# Patient Record
Sex: Female | Born: 1975 | Hispanic: Yes | Marital: Married | Smoking: Never smoker
Health system: Southern US, Community
[De-identification: ages and names within clinical notes are randomized; demographics above are authoritative.]

## PROBLEM LIST (undated history)

## (undated) DIAGNOSIS — M419 Scoliosis, unspecified: Secondary | ICD-10-CM

## (undated) DIAGNOSIS — H209 Unspecified iridocyclitis: Secondary | ICD-10-CM

## (undated) DIAGNOSIS — Z8619 Personal history of other infectious and parasitic diseases: Secondary | ICD-10-CM

## (undated) HISTORY — DX: Unspecified iridocyclitis: H20.9

## (undated) HISTORY — DX: Personal history of other infectious and parasitic diseases: Z86.19

## (undated) HISTORY — PX: LAPAROSCOPY: SHX197

---

## 2011-07-31 NOTE — L&D Delivery Note (Signed)
Delivery Note At 2:36 PM a viable and healthy female was delivered via  (Presentation:LOA ).  APGAR:9,9 ; weight pending.   Placenta status:spontaneous and intact with 3 vessel  Cord:  with the following complications: Loose [place] x one reduced.  Cord pH: na  Anesthesia: Epidural  Episiotomy: second degree done for non reassuring FHR Lacerations: none Suture Repair: 2.0 vicryl rapide Est. Blood Loss (mL): 300  Mom to postpartum.  Baby to nursery-stable.  Jazzy Parmer J 02/28/2012, 2:53 PM

## 2011-08-02 LAB — OB RESULTS CONSOLE RPR: RPR: NONREACTIVE

## 2011-08-02 LAB — OB RESULTS CONSOLE ABO/RH: RH Type: POSITIVE

## 2011-08-02 LAB — OB RESULTS CONSOLE GC/CHLAMYDIA: Chlamydia: NEGATIVE

## 2011-08-02 LAB — OB RESULTS CONSOLE HIV ANTIBODY (ROUTINE TESTING): HIV: NONREACTIVE

## 2011-08-02 LAB — OB RESULTS CONSOLE HEPATITIS B SURFACE ANTIGEN: Hepatitis B Surface Ag: NEGATIVE

## 2011-08-02 LAB — OB RESULTS CONSOLE RUBELLA ANTIBODY, IGM: Rubella: IMMUNE

## 2011-08-31 ENCOUNTER — Inpatient Hospital Stay (HOSPITAL_COMMUNITY): Admission: AD | Admit: 2011-08-31 | Payer: Self-pay | Source: Ambulatory Visit | Admitting: Obstetrics and Gynecology

## 2011-12-31 ENCOUNTER — Inpatient Hospital Stay (HOSPITAL_COMMUNITY): Admission: AD | Admit: 2011-12-31 | Payer: Self-pay | Source: Ambulatory Visit | Admitting: Obstetrics and Gynecology

## 2012-02-24 ENCOUNTER — Inpatient Hospital Stay (HOSPITAL_COMMUNITY): Admission: AD | Admit: 2012-02-24 | Payer: Self-pay | Source: Ambulatory Visit | Admitting: Obstetrics and Gynecology

## 2012-02-25 ENCOUNTER — Encounter (HOSPITAL_COMMUNITY): Payer: Self-pay | Admitting: *Deleted

## 2012-02-25 ENCOUNTER — Telehealth (HOSPITAL_COMMUNITY): Payer: Self-pay | Admitting: *Deleted

## 2012-02-25 NOTE — Telephone Encounter (Signed)
Preadmission screen  

## 2012-02-26 ENCOUNTER — Other Ambulatory Visit: Payer: Self-pay | Admitting: Obstetrics and Gynecology

## 2012-02-27 ENCOUNTER — Other Ambulatory Visit: Payer: Self-pay | Admitting: Obstetrics and Gynecology

## 2012-02-27 ENCOUNTER — Inpatient Hospital Stay (HOSPITAL_COMMUNITY)
Admission: RE | Admit: 2012-02-27 | Discharge: 2012-03-01 | DRG: 775 | Disposition: A | Discharge status: Home / Self Care | Payer: 59 | Source: Ambulatory Visit | Attending: Obstetrics and Gynecology | Admitting: Obstetrics and Gynecology

## 2012-02-27 ENCOUNTER — Encounter (HOSPITAL_COMMUNITY): Payer: Self-pay

## 2012-02-27 DIAGNOSIS — Z22330 Carrier of Group B streptococcus: Secondary | ICD-10-CM

## 2012-02-27 DIAGNOSIS — O99892 Other specified diseases and conditions complicating childbirth: Secondary | ICD-10-CM | POA: Diagnosis present

## 2012-02-27 DIAGNOSIS — O480 Post-term pregnancy: Principal | ICD-10-CM | POA: Diagnosis present

## 2012-02-27 HISTORY — DX: Scoliosis, unspecified: M41.9

## 2012-02-27 LAB — CBC
Hemoglobin: 12 g/dL (ref 12.0–15.0)
MCH: 25.5 pg — ABNORMAL LOW (ref 26.0–34.0)
MCHC: 31.3 g/dL (ref 30.0–36.0)
MCV: 81.5 fL (ref 78.0–100.0)
RBC: 4.7 MIL/uL (ref 3.87–5.11)

## 2012-02-27 MED ORDER — ACETAMINOPHEN 325 MG PO TABS: 650.0000 mg | ORAL_TABLET | ORAL | Status: DC | PRN

## 2012-02-27 MED ORDER — IBUPROFEN 600 MG PO TABS: 600.0000 mg | ORAL_TABLET | Freq: Four times a day (QID) | ORAL | Status: DC | PRN

## 2012-02-27 MED ORDER — CEFAZOLIN SODIUM 1-5 GM-% IV SOLN: 1.0000 g | Freq: Three times a day (TID) | INTRAVENOUS | Status: DC

## 2012-02-27 MED ORDER — OXYCODONE-ACETAMINOPHEN 5-325 MG PO TABS: 1.0000 | ORAL_TABLET | ORAL | Status: DC | PRN

## 2012-02-27 MED ORDER — LACTATED RINGERS IV SOLN
500.0000 mL | INTRAVENOUS | Status: DC | PRN
  Administered 2012-02-28: 1000 mL via INTRAVENOUS

## 2012-02-27 MED ORDER — CITRIC ACID-SODIUM CITRATE 334-500 MG/5ML PO SOLN: 30.0000 mL | ORAL | Status: DC | PRN

## 2012-02-27 MED ORDER — CEFAZOLIN SODIUM-DEXTROSE 2-3 GM-% IV SOLR: 2.0000 g | Freq: Once | INTRAVENOUS | Status: DC

## 2012-02-27 MED ORDER — ONDANSETRON HCL 4 MG/2ML IJ SOLN: 4.0000 mg | Freq: Four times a day (QID) | INTRAMUSCULAR | Status: DC | PRN

## 2012-02-27 MED ORDER — LACTATED RINGERS IV SOLN
INTRAVENOUS | Status: DC
  Administered 2012-02-27 – 2012-02-28 (×2): via INTRAVENOUS

## 2012-02-27 MED ORDER — LIDOCAINE HCL (PF) 1 % IJ SOLN: 30.0000 mL | INTRAMUSCULAR | Status: DC | PRN

## 2012-02-27 MED ORDER — CEFAZOLIN SODIUM 1-5 GM-% IV SOLN
1.0000 g | Freq: Three times a day (TID) | INTRAVENOUS | Status: DC
Start: 2012-02-28 — End: 2012-02-28
  Administered 2012-02-28 (×2): 1 g via INTRAVENOUS
  Filled 2012-02-27 (×4): qty 50

## 2012-02-27 MED ORDER — OXYTOCIN 40 UNITS IN LACTATED RINGERS INFUSION - SIMPLE MED
62.5000 mL/h | Freq: Once | INTRAVENOUS | Status: AC
  Administered 2012-02-28: 500 mL/h via INTRAVENOUS

## 2012-02-27 MED ORDER — TERBUTALINE SULFATE 1 MG/ML IJ SOLN: 0.2500 mg | Freq: Once | INTRAMUSCULAR | Status: AC | PRN

## 2012-02-27 MED ORDER — BUTORPHANOL TARTRATE 1 MG/ML IJ SOLN: 1.0000 mg | INTRAMUSCULAR | Status: DC | PRN

## 2012-02-27 MED ORDER — OXYTOCIN 40 UNITS IN LACTATED RINGERS INFUSION - SIMPLE MED
1.0000 m[IU]/min | INTRAVENOUS | Status: DC
  Administered 2012-02-28 (×2): 2 m[IU]/min via INTRAVENOUS
  Filled 2012-02-27: qty 1000

## 2012-02-27 MED ORDER — MISOPROSTOL 25 MCG QUARTER TABLET
25.0000 ug | ORAL_TABLET | ORAL | Status: DC | PRN
  Administered 2012-02-27 – 2012-02-28 (×2): 25 ug via VAGINAL
  Filled 2012-02-27 (×2): qty 0.25

## 2012-02-27 MED ORDER — ZOLPIDEM TARTRATE 5 MG PO TABS: 5.0000 mg | ORAL_TABLET | Freq: Every evening | ORAL | Status: DC | PRN

## 2012-02-27 MED ORDER — FLEET ENEMA 7-19 GM/118ML RE ENEM: 1.0000 | ENEMA | RECTAL | Status: DC | PRN

## 2012-02-27 MED ORDER — OXYTOCIN BOLUS FROM INFUSION
250.0000 mL | Freq: Once | INTRAVENOUS | Status: DC
  Filled 2012-02-27: qty 500

## 2012-02-27 MED ORDER — CEFAZOLIN SODIUM-DEXTROSE 2-3 GM-% IV SOLR
2.0000 g | Freq: Once | INTRAVENOUS | Status: AC
  Administered 2012-02-27: 2 g via INTRAVENOUS
  Filled 2012-02-27: qty 50

## 2012-02-27 NOTE — Progress Notes (Signed)
Charell Faulk is a 36 y.o. G3P0020 at [redacted]w[redacted]d by LMP admitted for induction of labor due to Post dates. Due date 7/25.  Subjective: Comfortable  Objective: BP 114/69  Pulse 84  Resp 18  Ht 5\' 7"  (1.702 m)  Wt 109.77 kg (242 lb)  BMI 37.90 kg/m2  LMP 05/20/2011      FHT:  FHR: 155 bpm, variability: moderate,  accelerations:  Present,  decelerations:  Absent UC:   none SVE:    2-3/80/-1  Labs: No results found for this basename: WBC, HGB, HCT, MCV, PLT    Assessment / Plan: Induction of labor due to postterm,  progressing well on pitocin GBS positive  Labor: Progressing normally Preeclampsia:  na Fetal Wellbeing:  Category I Pain Control:  Labor support without medications I/D:  n/a Anticipated MOD:  NSVD Cytotec ordered  Barrett Holthaus J 02/27/2012, 9:26 PM

## 2012-02-28 ENCOUNTER — Inpatient Hospital Stay (HOSPITAL_COMMUNITY): Payer: 59 | Admitting: Anesthesiology

## 2012-02-28 ENCOUNTER — Encounter (HOSPITAL_COMMUNITY): Payer: Self-pay | Admitting: Anesthesiology

## 2012-02-28 ENCOUNTER — Encounter (HOSPITAL_COMMUNITY): Payer: Self-pay

## 2012-02-28 LAB — ABO/RH: ABO/RH(D): O POS

## 2012-02-28 LAB — TYPE AND SCREEN
ABO/RH(D): O POS
Antibody Screen: NEGATIVE

## 2012-02-28 MED ORDER — EPHEDRINE 5 MG/ML INJ: 10.0000 mg | INTRAVENOUS | Status: DC | PRN

## 2012-02-28 MED ORDER — METHYLERGONOVINE MALEATE 0.2 MG/ML IJ SOLN: 0.2000 mg | INTRAMUSCULAR | Status: DC | PRN

## 2012-02-28 MED ORDER — PREDNISOLONE ACETATE 1 % OP SUSP
1.0000 [drp] | OPHTHALMIC | Status: DC
  Administered 2012-02-28 – 2012-03-01 (×14): 1 [drp] via OPHTHALMIC
  Filled 2012-02-28: qty 1

## 2012-02-28 MED ORDER — PRENATAL MULTIVITAMIN CH
1.0000 | ORAL_TABLET | Freq: Every day | ORAL | Status: DC
  Administered 2012-02-29 – 2012-03-01 (×2): 1 via ORAL
  Filled 2012-02-28 (×2): qty 1

## 2012-02-28 MED ORDER — DIPHENHYDRAMINE HCL 50 MG/ML IJ SOLN: 12.5000 mg | INTRAMUSCULAR | Status: DC | PRN

## 2012-02-28 MED ORDER — ONDANSETRON HCL 4 MG/2ML IJ SOLN: 4.0000 mg | INTRAMUSCULAR | Status: DC | PRN

## 2012-02-28 MED ORDER — SENNOSIDES-DOCUSATE SODIUM 8.6-50 MG PO TABS
2.0000 | ORAL_TABLET | Freq: Every day | ORAL | Status: DC
  Administered 2012-02-28: 2 via ORAL

## 2012-02-28 MED ORDER — BENZOCAINE-MENTHOL 20-0.5 % EX AERO
1.0000 "application " | INHALATION_SPRAY | CUTANEOUS | Status: DC | PRN
  Administered 2012-02-29: 1 via TOPICAL
  Filled 2012-02-28 (×2): qty 56

## 2012-02-28 MED ORDER — DIPHENHYDRAMINE HCL 25 MG PO CAPS: 25.0000 mg | ORAL_CAPSULE | Freq: Four times a day (QID) | ORAL | Status: DC | PRN

## 2012-02-28 MED ORDER — FENTANYL 2.5 MCG/ML BUPIVACAINE 1/10 % EPIDURAL INFUSION (WH - ANES)
14.0000 mL/h | INTRAMUSCULAR | Status: DC
  Administered 2012-02-28: 14 mL/h via EPIDURAL
  Filled 2012-02-28 (×3): qty 60

## 2012-02-28 MED ORDER — IBUPROFEN 600 MG PO TABS
600.0000 mg | ORAL_TABLET | Freq: Four times a day (QID) | ORAL | Status: DC
  Administered 2012-02-28 – 2012-03-01 (×9): 600 mg via ORAL
  Filled 2012-02-28 (×9): qty 1

## 2012-02-28 MED ORDER — METHYLERGONOVINE MALEATE 0.2 MG PO TABS: 0.2000 mg | ORAL_TABLET | ORAL | Status: DC | PRN

## 2012-02-28 MED ORDER — LIDOCAINE HCL (PF) 1 % IJ SOLN
INTRAMUSCULAR | Status: DC | PRN
  Administered 2012-02-28: 5 mL
  Administered 2012-02-28: 8 mL

## 2012-02-28 MED ORDER — PHENYLEPHRINE 40 MCG/ML (10ML) SYRINGE FOR IV PUSH (FOR BLOOD PRESSURE SUPPORT): 80.0000 ug | PREFILLED_SYRINGE | INTRAVENOUS | Status: DC | PRN

## 2012-02-28 MED ORDER — EPHEDRINE 5 MG/ML INJ
10.0000 mg | INTRAVENOUS | Status: DC | PRN
  Filled 2012-02-28: qty 4

## 2012-02-28 MED ORDER — WITCH HAZEL-GLYCERIN EX PADS: 1.0000 "application " | MEDICATED_PAD | CUTANEOUS | Status: DC | PRN

## 2012-02-28 MED ORDER — PHENYLEPHRINE 40 MCG/ML (10ML) SYRINGE FOR IV PUSH (FOR BLOOD PRESSURE SUPPORT)
80.0000 ug | PREFILLED_SYRINGE | INTRAVENOUS | Status: DC | PRN
  Filled 2012-02-28: qty 5

## 2012-02-28 MED ORDER — ZOLPIDEM TARTRATE 5 MG PO TABS: 5.0000 mg | ORAL_TABLET | Freq: Every evening | ORAL | Status: DC | PRN

## 2012-02-28 MED ORDER — SIMETHICONE 80 MG PO CHEW: 80.0000 mg | CHEWABLE_TABLET | ORAL | Status: DC | PRN

## 2012-02-28 MED ORDER — LANOLIN HYDROUS EX OINT: TOPICAL_OINTMENT | CUTANEOUS | Status: DC | PRN

## 2012-02-28 MED ORDER — TETANUS-DIPHTH-ACELL PERTUSSIS 5-2.5-18.5 LF-MCG/0.5 IM SUSP: 0.5000 mL | Freq: Once | INTRAMUSCULAR | Status: DC

## 2012-02-28 MED ORDER — DIBUCAINE 1 % RE OINT: 1.0000 "application " | TOPICAL_OINTMENT | RECTAL | Status: DC | PRN

## 2012-02-28 MED ORDER — FENTANYL 2.5 MCG/ML BUPIVACAINE 1/10 % EPIDURAL INFUSION (WH - ANES)
INTRAMUSCULAR | Status: DC | PRN
  Administered 2012-02-28: 14 mL/h via EPIDURAL

## 2012-02-28 MED ORDER — OXYCODONE-ACETAMINOPHEN 5-325 MG PO TABS
1.0000 | ORAL_TABLET | ORAL | Status: DC | PRN
  Administered 2012-02-29: 1 via ORAL
  Filled 2012-02-28: qty 1

## 2012-02-28 MED ORDER — LACTATED RINGERS IV SOLN: 500.0000 mL | Freq: Once | INTRAVENOUS | Status: DC

## 2012-02-28 MED ORDER — ONDANSETRON HCL 4 MG PO TABS: 4.0000 mg | ORAL_TABLET | ORAL | Status: DC | PRN

## 2012-02-28 NOTE — Anesthesia Procedure Notes (Signed)
Epidural Patient location during procedure: OB Start time: 02/28/2012 9:05 AM End time: 02/28/2012 9:11 AM Reason for block: procedure for pain  Staffing Anesthesiologist: Sandrea Hughs Performed by: anesthesiologist   Preanesthetic Checklist Completed: patient identified, site marked, surgical consent, pre-op evaluation, timeout performed, IV checked, risks and benefits discussed and monitors and equipment checked  Epidural Patient position: sitting Prep: site prepped and draped and DuraPrep Patient monitoring: continuous pulse ox and blood pressure Approach: midline Injection technique: LOR air  Needle:  Needle type: Tuohy  Needle gauge: 17 G Needle length: 9 cm Needle insertion depth: 7 cm Catheter type: closed end flexible Catheter size: 19 Gauge Catheter at skin depth: 12 cm Test dose: negative and Other  Assessment Sensory level: T8 Events: blood not aspirated, injection not painful, no injection resistance, negative IV test and no paresthesia

## 2012-02-28 NOTE — Progress Notes (Signed)
Angela Glenn is a 36 y.o. G3P0020 at [redacted]w[redacted]d by LMP admitted for induction of labor due to Post dates. Due date 7/28.  Subjective: Pushing  Objective: BP 106/20  Pulse 92  Temp 97.8 F (36.6 C) (Oral)  Resp 18  Ht 5\' 7"  (1.702 m)  Wt 109.77 kg (242 lb)  BMI 37.90 kg/m2  SpO2 100%  LMP 05/20/2011      FHT:  FHR: 145 bpm, variability: moderate,  accelerations:  Present,  decelerations:  Present intermittent variable decels, occ late return UC:   regular, every 2-3 minutes SVE:   Dilation: 10 Effacement (%): 80 Station: +1 Exam by:: Dherr rn  Labs: Lab Results  Component Value Date   WBC 13.2* 02/27/2012   HGB 12.0 02/27/2012   HCT 38.3 02/27/2012   MCV 81.5 02/27/2012   PLT 237 02/27/2012    Assessment / Plan: Induction of labor due to postterm,  progressing well on pitocin  Labor: Pushing Preeclampsia:  na Fetal Wellbeing:  Category I and Category II Pain Control:  Epidural I/D:  n/a Anticipated MOD:  NSVD  Angela Glenn J 02/28/2012, 12:55 PM

## 2012-02-28 NOTE — Anesthesia Preprocedure Evaluation (Signed)
Anesthesia Evaluation  Patient identified by MRN, date of birth, ID band Patient awake    Reviewed: Allergy & Precautions, H&P , NPO status , Patient's Chart, lab work & pertinent test results  Airway Mallampati: II TM Distance: >3 FB Neck ROM: full    Dental No notable dental hx.    Pulmonary neg pulmonary ROS,  breath sounds clear to auscultation  Pulmonary exam normal       Cardiovascular negative cardio ROS      Neuro/Psych negative neurological ROS  negative psych ROS   GI/Hepatic negative GI ROS, Neg liver ROS,   Endo/Other  Morbid obesity  Renal/GU negative Renal ROS  negative genitourinary   Musculoskeletal negative musculoskeletal ROS (+)   Abdominal (+) + obese,   Peds negative pediatric ROS (+)  Hematology negative hematology ROS (+)   Anesthesia Other Findings   Reproductive/Obstetrics (+) Pregnancy                           Anesthesia Physical Anesthesia Plan  ASA: III  Anesthesia Plan: Epidural   Post-op Pain Management:    Induction:   Airway Management Planned:   Additional Equipment:   Intra-op Plan:   Post-operative Plan:   Informed Consent: I have reviewed the patients History and Physical, chart, labs and discussed the procedure including the risks, benefits and alternatives for the proposed anesthesia with the patient or authorized representative who has indicated his/her understanding and acceptance.     Plan Discussed with:   Anesthesia Plan Comments:         Anesthesia Quick Evaluation  

## 2012-02-28 NOTE — Progress Notes (Signed)
September Mormile is a 36 y.o. G3P0020 at [redacted]w[redacted]d by LMP admitted for induction of labor due to Post dates. Due date 7/28.  Subjective: Comfortable  Objective: BP 103/60  Pulse 75  Temp 97.8 F (36.6 C) (Oral)  Resp 18  Ht 5\' 7"  (1.702 m)  Wt 109.77 kg (242 lb)  BMI 37.90 kg/m2  LMP 05/20/2011      FHT:  FHR: 155 bpm, variability: moderate,  accelerations:  Present,  decelerations:  Absent UC:   irregular, every 2-5 minutes SVE:   4/80/0 AROM- clear  Labs: Lab Results  Component Value Date   WBC 13.2* 02/27/2012   HGB 12.0 02/27/2012   HCT 38.3 02/27/2012   MCV 81.5 02/27/2012   PLT 237 02/27/2012    Assessment / Plan: Induction of labor due to postterm,  progressing well on pitocin  Labor: Progressing on Pitocin, will continue to increase then AROM Preeclampsia:  na Fetal Wellbeing:  Category I Pain Control:  Labor support without medications I/D:  n/a Anticipated MOD:  NSVD  Sabriel Borromeo J 02/28/2012, 6:58 AM

## 2012-02-28 NOTE — H&P (Signed)
Angela Glenn, Angela Glenn NO.:  [id number]  MEDICAL RECORD NO.:  [id number]  LOCATION:  9173                          FACILITY:  [facility]  PHYSICIAN:  Lenoard Aden, M.D.DATE OF BIRTH:  Aug 26, 1975  DATE OF ADMISSION:  02/27/2012 DATE OF DISCHARGE:                             HISTORY & PHYSICAL   CHIEF COMPLAINT:  Post-date pregnancy.  HISTORY OF PRESENT ILLNESS:  She is a 36 year old white female, G3, P0, at 99 and 4/7th weeks gestation who presents for induction of labor.  ALLERGIES:  To AMOXICILLIN.  MEDICATIONS:  Include prenatal vitamins.  She is a nonsmoker, nondrinker.  She denies domestic or physical violence.  She has a previous history of repetitive pregnancy loss x2.  She has family history of Sjogren syndrome, diabetes, heart disease, hypertension, anemia, osteoarthritis and depression.  She has a personal history of no medical or surgical hospitalizations. She has a personal history of sinusitis, uveitis, and recurrent pregnancy loss.  Prenatal course has been uncomplicated.  PHYSICAL EXAMINATION:  GENERAL:  She is a well-developed, well- nourished, white female, in no acute distress. HEENT:  Normal. NECK:  Supple.  Full range of motion. LUNGS:  Clear. HEART:  Regular rhythm. ABDOMEN:  Soft, gravid, nontender.  Estimated fetal weight 7 pounds. Cervix is 2-3 cm, 80%, vertex, -1, posterior. EXTREMITIES: There are no cords. NEUROLOGIC:  Nonfocal. SKIN:  Intact. PELVIC: NST is reactive.  Cytotec placed.  IMPRESSION: 1. Post-term pregnancy. 2. Group B streptococcus positive. 3. Amoxicillin allergic.  PLAN:  Cytotec now.  Pitocin in the morning.  Epidural as needed.  Start Ancef prophylaxis with labor. Anticipate cautious attempts at vaginal delivery.     Lenoard Aden, M.D.     RJT/MEDQ  D:  02/27/2012  T:  02/28/2012  Job:  [id number]

## 2012-02-28 NOTE — Progress Notes (Signed)
Angela Glenn is a 36 y.o. G3P0020 at [redacted]w[redacted]d by LMP admitted for induction of labor due to Post dates. Due date 7/28.  Subjective: Feels pressure Pushing now   Objective: BP 96/62  Pulse 69  Temp 98.2 F (36.8 C) (Axillary)  Resp 18  Ht 5\' 7"  (1.702 m)  Wt 109.77 kg (242 lb)  BMI 37.90 kg/m2  SpO2 100%  LMP 05/20/2011      FHT:  FHR: 120-130 bpm, variability: moderate,  accelerations:  Present,  decelerations:  Present variables with occc late return noted. UC:   regular, every 2-3 minutes SVE:   Dilation: 10 Effacement (%): 80 Station: +1 Exam by:: Dherr rn  Labs: Lab Results  Component Value Date   WBC 13.2* 02/27/2012   HGB 12.0 02/27/2012   HCT 38.3 02/27/2012   MCV 81.5 02/27/2012   PLT 237 02/27/2012    Assessment / Plan: Induction of labor due to postterm,  progressing well on pitocin  Labor: Progressing normally Preeclampsia:  na Fetal Wellbeing:  Category I and Category II Pain Control:  Epidural I/D:  n/a Anticipated MOD:  NSVD Continue to assess FHR tracing closely.  Cyrah Mclamb J 02/28/2012, 12:01 PM

## 2012-02-29 ENCOUNTER — Encounter (HOSPITAL_COMMUNITY): Payer: Self-pay

## 2012-02-29 LAB — CBC
HCT: 33.8 % — ABNORMAL LOW (ref 36.0–46.0)
Hemoglobin: 10.6 g/dL — ABNORMAL LOW (ref 12.0–15.0)
MCV: 81.6 fL (ref 78.0–100.0)
RBC: 4.14 MIL/uL (ref 3.87–5.11)
WBC: 17.6 10*3/uL — ABNORMAL HIGH (ref 4.0–10.5)

## 2012-02-29 NOTE — Progress Notes (Signed)
Patient ID: Angela Glenn, female   DOB: August 01, 1975, 36 y.o.   MRN: [id number] PPD # 1  Subjective: Pt reports feeling well; some occ cramping/ Pain controlled with ibuprofen Tolerating po/ Voiding without problems/ No n/v Bleeding is light Newborn info:  Information for the patient's newborn:  Glendine, Swetz [[id number]]  female Feeding: breast   Objective:  VS: Blood pressure 118/76, pulse 77, temperature 98.4 F (36.9 C), temperature source Oral, resp. rate 16.    Basename 02/29/12 0525 02/27/12 2100  WBC 17.6* 13.2*  HGB 10.6* 12.0  HCT 33.8* 38.3  PLT 203 237    Blood type: --/--/O POS, O POS (07/31 2100) Rubella: Immune (01/03 0000)    Physical Exam:  General: A & O x 3  alert, cooperative and no distress CV: Regular rate and rhythm Resp: clear Abdomen: soft, nontender, normal bowel sounds Uterine Fundus: firm, below umbilicus, nontender Perineum: not inspected Ext: edema +2 pedal and pretib and Homans sign is negative, no sign of DVT   A/P: PPD # 1/ G3P1021/ S/P:spontaneous vaginal delivery with episiotomy Doing well Continue routine post partum orders Anticipate D/C home in AM    Demetrius Revel, MSN, [facility] 02/29/2012, 9:03 AM

## 2012-02-29 NOTE — Anesthesia Postprocedure Evaluation (Signed)
Anesthesia Post Note  Patient: Angela Glenn  Procedure(s) Performed: * No procedures listed *  Anesthesia type: Epidural  Patient location: Mother/Baby  Post pain: Pain level controlled  Post assessment: Post-op Vital signs reviewed  Last Vitals:  Filed Vitals:   02/29/12 0544  BP: 118/76  Pulse: 77  Temp: 36.9 C  Resp: 16    Post vital signs: Reviewed  Level of consciousness: awake  Complications: No apparent anesthesia complications

## 2012-03-01 MED ORDER — OXYCODONE-ACETAMINOPHEN 5-325 MG PO TABS: 1.0000 | ORAL_TABLET | Freq: Four times a day (QID) | ORAL | Status: AC | PRN

## 2012-03-01 MED ORDER — IBUPROFEN 600 MG PO TABS: 600.0000 mg | ORAL_TABLET | Freq: Four times a day (QID) | ORAL | Status: AC

## 2012-03-01 MED ORDER — DOCUSATE SODIUM 100 MG PO CAPS: 100.0000 mg | ORAL_CAPSULE | Freq: Three times a day (TID) | ORAL | Status: AC | PRN

## 2012-03-01 NOTE — Discharge Summary (Signed)
Obstetric Discharge Summary Reason for Admission: Post dates @ 40w 4d for induction; GBS pos Prenatal Procedures: NST and ultrasound Intrapartum Procedures: spontaneous vaginal delivery Postpartum Procedures: none Complications-Operative and Postpartum: episiotomy Hemoglobin  Date Value Range Status  02/29/2012 10.6* 12.0 - 15.0 g/dL Final     HCT  Date Value Range Status  02/29/2012 33.8* 36.0 - 46.0 % Final    Physical Exam:  General: alert, cooperative and no distress Lochia: appropriate Uterine Fundus: firm Incision: n/a DVT Evaluation: No evidence of DVT seen on physical exam. Negative Homan's sign. +1 - +2 pedal edema  Discharge Diagnoses: Term Pregnancy-delivered  Discharge Information: Date: 03/01/2012 Activity: pelvic rest Diet: routine Medications: PNV, Colace, Iron and Percocet Condition: stable Instructions: refer to practice specific booklet Discharge to: home Follow-up Information    Follow up with Lenoard Aden, MD in 6 weeks.   Contact information:   [address] [place] [address] [phone]          Newborn Data: Live born female on 02/28/12 Birth Weight: 7 lb 7.2 oz (3379 g) APGAR: 9, 9  Home with mother.  Megin Consalvo K 03/01/2012, 9:38 AM

## 2012-03-01 NOTE — Progress Notes (Signed)
Patient ID: Angela Glenn, female   DOB: 08/22/1975, 36 y.o.   MRN: [id number] PPD # 2  Subjective: Pt reports feeling tired, but well and eager for d/c home/ Pain controlled with ibuprofen and percocet Tolerating po/ Voiding without problems/ No n/v Bleeding is light/ Newborn info:  Information for the patient's newborn:  [name], [name] [[id number]]  female Feeding: breast    Objective:  VS: Blood pressure 98/64, pulse 78, temperature 98.3 F (36.8 C), temperature source Oral, resp. rate 18.    Basename 02/29/12 0525 02/27/12 2100  WBC 17.6* 13.2*  HGB 10.6* 12.0  HCT 33.8* 38.3  PLT 203 237    Blood type: --/--/O POS, O POS (07/31 2100) Rubella: Immune (01/03 0000)    Physical Exam:  General: A & O x 3  alert, cooperative and no distress CV: Regular rate and rhythm Resp: clear Abdomen: soft, nontender, normal bowel sounds Uterine Fundus: firm, below umbilicus, nontender Perineum: healing with good reapproximation; mild edema Lochia: minimal Ext: edema +1 to +2 and Homans sign is negative, no sign of DVT   A/P: PPD # 2/ G3P1021/ S/P: SVD w/ episiotomy Doing well and stable for discharge home RX: Ibuprofen 600mg  po Q 6 hrs prn pain #30 Refill x 1 Percocet 5/325 1 to 2 po Q 4 hrs prn pain #15 No refill Colace 100mg  po BID prn #60 Ref x 1 WOB/GYN booklet given Routine pp visit in 6wks   Demetrius Revel, MSN, [facility] 03/01/2012, 9:32 AM

## 2012-03-02 ENCOUNTER — Encounter (HOSPITAL_COMMUNITY)
Admission: RE | Admit: 2012-03-02 | Discharge: 2012-03-02 | Disposition: A | Discharge status: Home / Self Care | Payer: 59 | Source: Ambulatory Visit | Attending: Obstetrics and Gynecology | Admitting: Obstetrics and Gynecology

## 2012-04-02 ENCOUNTER — Encounter (HOSPITAL_COMMUNITY)
Admission: RE | Admit: 2012-04-02 | Discharge: 2012-04-02 | Disposition: A | Discharge status: Home / Self Care | Payer: 59 | Source: Ambulatory Visit | Attending: Obstetrics and Gynecology | Admitting: Obstetrics and Gynecology

## 2012-04-02 DIAGNOSIS — O923 Agalactia: Secondary | ICD-10-CM | POA: Insufficient documentation

## 2014-05-31 ENCOUNTER — Encounter (HOSPITAL_COMMUNITY): Payer: Self-pay

## 2014-11-22 ENCOUNTER — Encounter (HOSPITAL_BASED_OUTPATIENT_CLINIC_OR_DEPARTMENT_OTHER): Payer: Self-pay | Admitting: *Deleted

## 2014-11-22 ENCOUNTER — Emergency Department (HOSPITAL_BASED_OUTPATIENT_CLINIC_OR_DEPARTMENT_OTHER)
Admission: EM | Admit: 2014-11-22 | Discharge: 2014-11-22 | Disposition: A | Discharge status: Home / Self Care | Payer: 59 | Attending: Emergency Medicine | Admitting: Emergency Medicine

## 2014-11-22 DIAGNOSIS — Z7952 Long term (current) use of systemic steroids: Secondary | ICD-10-CM | POA: Diagnosis not present

## 2014-11-22 DIAGNOSIS — Z8619 Personal history of other infectious and parasitic diseases: Secondary | ICD-10-CM | POA: Insufficient documentation

## 2014-11-22 DIAGNOSIS — Z880 Allergy status to penicillin: Secondary | ICD-10-CM | POA: Insufficient documentation

## 2014-11-22 DIAGNOSIS — E876 Hypokalemia: Secondary | ICD-10-CM | POA: Diagnosis not present

## 2014-11-22 DIAGNOSIS — Z8739 Personal history of other diseases of the musculoskeletal system and connective tissue: Secondary | ICD-10-CM | POA: Insufficient documentation

## 2014-11-22 DIAGNOSIS — R630 Anorexia: Secondary | ICD-10-CM | POA: Insufficient documentation

## 2014-11-22 DIAGNOSIS — Z3202 Encounter for pregnancy test, result negative: Secondary | ICD-10-CM | POA: Insufficient documentation

## 2014-11-22 DIAGNOSIS — R112 Nausea with vomiting, unspecified: Secondary | ICD-10-CM | POA: Insufficient documentation

## 2014-11-22 DIAGNOSIS — R5383 Other fatigue: Secondary | ICD-10-CM | POA: Insufficient documentation

## 2014-11-22 DIAGNOSIS — Z8669 Personal history of other diseases of the nervous system and sense organs: Secondary | ICD-10-CM | POA: Diagnosis not present

## 2014-11-22 DIAGNOSIS — R197 Diarrhea, unspecified: Secondary | ICD-10-CM | POA: Diagnosis not present

## 2014-11-22 DIAGNOSIS — E860 Dehydration: Secondary | ICD-10-CM | POA: Diagnosis not present

## 2014-11-22 DIAGNOSIS — Z79899 Other long term (current) drug therapy: Secondary | ICD-10-CM | POA: Diagnosis not present

## 2014-11-22 DIAGNOSIS — R1084 Generalized abdominal pain: Secondary | ICD-10-CM | POA: Insufficient documentation

## 2014-11-22 LAB — URINE MICROSCOPIC-ADD ON

## 2014-11-22 LAB — COMPREHENSIVE METABOLIC PANEL
ALT: 25 U/L (ref 0–35)
AST: 22 U/L (ref 0–37)
Albumin: 4 g/dL (ref 3.5–5.2)
Alkaline Phosphatase: 98 U/L (ref 39–117)
Anion gap: 9 (ref 5–15)
BUN: 16 mg/dL (ref 6–23)
CO2: 24 mmol/L (ref 19–32)
Calcium: 8.6 mg/dL (ref 8.4–10.5)
Chloride: 104 mmol/L (ref 96–112)
Creatinine, Ser: 0.72 mg/dL (ref 0.50–1.10)
GFR calc Af Amer: >90 mL/min (ref >90)
GFR calc non Af Amer: >90 mL/min (ref >90)
Glucose, Bld: 124 mg/dL — ABNORMAL HIGH (ref 70–99)
Potassium: 3.2 mmol/L — ABNORMAL LOW (ref 3.5–5.1)
Sodium: 137 mmol/L (ref 135–145)
Total Bilirubin: 1.3 mg/dL — ABNORMAL HIGH (ref 0.3–1.2)
Total Protein: 7.5 g/dL (ref 6.0–8.3)

## 2014-11-22 LAB — CBC WITH DIFFERENTIAL/PLATELET
Basophils Absolute: 0 10*3/uL (ref 0.0–0.1)
Basophils Relative: 0 % (ref 0–1)
Eosinophils Absolute: 0 10*3/uL (ref 0.0–0.7)
Eosinophils Relative: 0 % (ref 0–5)
HCT: 49.5 % — ABNORMAL HIGH (ref 36.0–46.0)
Hemoglobin: 16.1 g/dL — ABNORMAL HIGH (ref 12.0–15.0)
Lymphocytes Relative: 4 % — ABNORMAL LOW (ref 12–46)
Lymphs Abs: 0.5 10*3/uL — ABNORMAL LOW (ref 0.7–4.0)
MCH: 25.8 pg — ABNORMAL LOW (ref 26.0–34.0)
MCHC: 32.5 g/dL (ref 30.0–36.0)
MCV: 79.5 fL (ref 78.0–100.0)
Monocytes Absolute: 0.7 10*3/uL (ref 0.1–1.0)
Monocytes Relative: 6 % (ref 3–12)
Neutro Abs: 11.1 10*3/uL — ABNORMAL HIGH (ref 1.7–7.7)
Neutrophils Relative %: 90 % — ABNORMAL HIGH (ref 43–77)
Platelets: 270 10*3/uL (ref 150–400)
RBC: 6.23 MIL/uL — ABNORMAL HIGH (ref 3.87–5.11)
RDW: 16.6 % — ABNORMAL HIGH (ref 11.5–15.5)
WBC: 12.4 10*3/uL — ABNORMAL HIGH (ref 4.0–10.5)

## 2014-11-22 LAB — URINALYSIS, ROUTINE W REFLEX MICROSCOPIC
Glucose, UA: NEGATIVE mg/dL
Ketones, ur: >80 mg/dL — AB
Nitrite: NEGATIVE
PH: 5.5 (ref 5.0–8.0)
PROTEIN: 30 mg/dL — AB
Specific Gravity, Urine: 1.036 — ABNORMAL HIGH (ref 1.005–1.030)
UROBILINOGEN UA: 0.2 mg/dL (ref 0.0–1.0)

## 2014-11-22 LAB — LIPASE, BLOOD: Lipase: 22 U/L (ref 11–59)

## 2014-11-22 LAB — PREGNANCY, URINE: PREG TEST UR: NEGATIVE

## 2014-11-22 MED ORDER — PROMETHAZINE HCL 25 MG PO TABS
25.0000 mg | ORAL_TABLET | Freq: Four times a day (QID) | ORAL | Status: DC | PRN
Start: 2014-11-22 — End: 2019-06-30

## 2014-11-22 MED ORDER — POTASSIUM CHLORIDE CRYS ER 20 MEQ PO TBCR
40.0000 meq | EXTENDED_RELEASE_TABLET | Freq: Once | ORAL | Status: AC
  Administered 2014-11-22: 40 meq via ORAL
  Filled 2014-11-22: qty 2

## 2014-11-22 MED ORDER — ONDANSETRON HCL 4 MG/2ML IJ SOLN
4.0000 mg | Freq: Once | INTRAMUSCULAR | Status: AC
  Administered 2014-11-22: 4 mg via INTRAVENOUS
  Filled 2014-11-22: qty 2

## 2014-11-22 MED ORDER — SODIUM CHLORIDE 0.9 % IV BOLUS (SEPSIS)
1000.0000 mL | Freq: Once | INTRAVENOUS | Status: AC
  Administered 2014-11-22: 1000 mL via INTRAVENOUS

## 2014-11-22 NOTE — ED Notes (Signed)
Pt given fluids and crackers for po challenge.

## 2014-11-22 NOTE — ED Notes (Signed)
diarrhea and vomiting since last night.

## 2014-11-22 NOTE — ED Provider Notes (Signed)
CSN: [id number]     Arrival date & time 11/22/14  1832 History   First MD Initiated Contact with Patient 11/22/14 1907     Chief Complaint  Patient presents with  . Diarrhea  . Emesis     (Consider location/radiation/quality/duration/timing/severity/associated sxs/prior Treatment) HPI Pt is a 39yo female presenting to ED with reports of over 10 episodes of vomiting and diarrhea as well as 10 episodes of watery diarrhea w/o blood or mucous, symptoms started last night after pt ate "authentic [place] food"  Last episode of emesis was "projectile" around 3PM and she had 1 loose stool just PTA.  Pt reports generalized fatigue. Believes it is food poisoning or a virus but states no sick contacts.  States everyone else who ate with her is not sick but does state her meal had different meat than her husband's meal.  No recent travel. Reports hx of laparoscopic surgery for an abdominal cyst "many years ago" no other abdominal surgeries. Denies urinary or vaginal symptoms.   Past Medical History  Diagnosis Date  . H/O varicella   . Uveitis     uses steroid eye drops  . Scoliosis   . 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 Laparoscopy     Family History  Problem Relation Age of Onset  . Sjogren's syndrome Mother   . Hypertension Mother   . Arthritis Mother   . Anemia Mother   . Diabetes Father   . Heart attack Maternal Grandfather   . Hypertension Maternal Grandfather    History  Substance Use Topics  . Smoking status: Never Smoker   . Smokeless tobacco: Never Used  . Alcohol Use: No   OB History    Gravida Para Term Preterm AB TAB SAB Ectopic Multiple Living   3 1 1  2  2   1      Review of Systems  Constitutional: Positive for appetite change and fatigue. Negative for fever and chills.  Respiratory: Negative for cough and shortness of breath.   Cardiovascular: Negative for chest pain and palpitations.  Gastrointestinal: Positive for  nausea, vomiting, abdominal pain ( diffuse) and diarrhea. Negative for constipation.  Genitourinary: Negative for dysuria, urgency, frequency, hematuria, flank pain, decreased urine volume, vaginal bleeding, vaginal discharge, vaginal pain and menstrual problem.  Musculoskeletal: Negative for myalgias and back pain.  All other systems reviewed and are negative.     Allergies  Amoxicillin  Home Medications   Prior to Admission medications   Medication Sig Start Date End Date Taking? Authorizing Provider  prednisoLONE acetate (PRED FORTE) 1 % ophthalmic suspension 1 drop 4 (four) times daily.   Yes Historical Provider, MD  calcium acetate (PHOSLO) 667 MG capsule Take 667 mg by mouth 3 (three) times daily with meals.    Historical Provider, MD  ferrous sulfate 325 (65 FE) MG tablet Take 325 mg by mouth daily with breakfast.    Historical Provider, MD  folic acid (FOLVITE) 1 MG tablet Take 1 mg by mouth daily.    Historical Provider, MD  Prenatal Vit-Fe Fumarate-FA (PRENATAL MULTIVITAMIN) TABS Take 1 tablet by mouth daily.    Historical Provider, MD  promethazine (PHENERGAN) 25 MG tablet Take 1 tablet (25 mg total) by mouth every 6 (six) hours as needed for nausea or vomiting. 11/22/14   Junius FinnerErin O'Malley, PA-C   BP 108/67 mmHg  Pulse 84  Temp(Src) 98.5 F (36.9 C) (Oral)  Resp 20  Ht 5\' 7"  (1.702  m)  Wt 220 lb (99.791 kg)  BMI 34.45 kg/m2  SpO2 98%  LMP 11/22/2014 Physical Exam  Constitutional: She appears well-developed and well-nourished. No distress.  HENT:  Head: Normocephalic and atraumatic.  Mouth/Throat: Oropharynx is clear and moist.  Eyes: Conjunctivae are normal. No scleral icterus.  Neck: Normal range of motion. Neck supple.  Cardiovascular: Normal rate, regular rhythm and normal heart sounds.   Pulmonary/Chest: Effort normal and breath sounds normal. No respiratory distress. She has no wheezes. She has no rales. She exhibits no tenderness.  Abdominal: Soft. Bowel sounds  are normal. She exhibits no distension and no mass. There is tenderness. There is no rebound and no guarding.  Soft, non-distended, mild diffuse tenderness. No CVAT  Musculoskeletal: Normal range of motion.  Neurological: She is alert.  Skin: Skin is warm and dry. She is not diaphoretic.  Nursing note and vitals reviewed.   ED Course  Procedures (including critical care time) Labs Review Labs Reviewed  URINALYSIS, ROUTINE W REFLEX MICROSCOPIC - Abnormal; Notable for the following:    Color, Urine AMBER (*)    APPearance TURBID (*)    Specific Gravity, Urine 1.036 (*)    Hgb urine dipstick LARGE (*)    Bilirubin Urine SMALL (*)    Ketones, ur >80 (*)    Protein, ur 30 (*)    Leukocytes, UA SMALL (*)    All other components within normal limits  URINE MICROSCOPIC-ADD ON - Abnormal; Notable for the following:    Squamous Epithelial / LPF MANY (*)    Bacteria, UA FEW (*)    All other components within normal limits  COMPREHENSIVE METABOLIC PANEL - Abnormal; Notable for the following:    Potassium 3.2 (*)    Glucose, Bld 124 (*)    Total Bilirubin 1.3 (*)    All other components within normal limits  CBC WITH DIFFERENTIAL/PLATELET - Abnormal; Notable for the following:    WBC 12.4 (*)    RBC 6.23 (*)    Hemoglobin 16.1 (*)    HCT 49.5 (*)    MCH 25.8 (*)    RDW 16.6 (*)    Neutrophils Relative % 90 (*)    Neutro Abs 11.1 (*)    Lymphocytes Relative 4 (*)    Lymphs Abs 0.5 (*)    All other components within normal limits  PREGNANCY, URINE  LIPASE, BLOOD    Imaging Review No results found.   EKG Interpretation None      MDM   Final diagnoses:  Nausea vomiting and diarrhea  Hypokalemia  Mild dehydration    Pt is a 39yo female presenting to ED with reports of n/v/d after eating [place] food last night. No sick contacts or recent travel. Pt has mild diffuse tenderness in abdomen, low suspicion for acute abdomen, including but not limited to SBO, cholecystitis,  appendicitis, TOA, ectopic pregnancy.   Labs: c/w dehydration. Pt given 2L IV fluids and zofran in ED.  Pt able to keep down PO fluids as well as crackers. Pt states she feels much improved and comfortable being discharged home. No further workup indicated at this time. Home care instructions provided. Rx: phenergan. Return precautions provided. Pt verbalized understanding and agreement with tx plan.   Junius Finner, PA-C 11/22/14 2150  Geoffery Lyons, MD 11/22/14 [phone]

## 2015-06-28 ENCOUNTER — Other Ambulatory Visit: Payer: Self-pay | Admitting: Obstetrics and Gynecology

## 2015-06-28 DIAGNOSIS — R928 Other abnormal and inconclusive findings on diagnostic imaging of breast: Secondary | ICD-10-CM

## 2015-07-04 ENCOUNTER — Other Ambulatory Visit: Payer: 59

## 2015-07-07 ENCOUNTER — Ambulatory Visit
Admission: RE | Admit: 2015-07-07 | Discharge: 2015-07-07 | Disposition: A | Discharge status: Home / Self Care | Payer: 59 | Source: Ambulatory Visit | Attending: Obstetrics and Gynecology | Admitting: Obstetrics and Gynecology

## 2015-07-07 DIAGNOSIS — R928 Other abnormal and inconclusive findings on diagnostic imaging of breast: Secondary | ICD-10-CM

## 2017-01-01 ENCOUNTER — Encounter (HOSPITAL_BASED_OUTPATIENT_CLINIC_OR_DEPARTMENT_OTHER): Payer: Self-pay | Admitting: *Deleted

## 2017-01-01 ENCOUNTER — Emergency Department (HOSPITAL_BASED_OUTPATIENT_CLINIC_OR_DEPARTMENT_OTHER)
Admission: EM | Admit: 2017-01-01 | Discharge: 2017-01-01 | Disposition: A | Discharge status: Home / Self Care | Payer: 59 | Attending: Physician Assistant | Admitting: Physician Assistant

## 2017-01-01 ENCOUNTER — Emergency Department (HOSPITAL_BASED_OUTPATIENT_CLINIC_OR_DEPARTMENT_OTHER): Payer: 59

## 2017-01-01 DIAGNOSIS — M545 Low back pain: Secondary | ICD-10-CM | POA: Insufficient documentation

## 2017-01-01 DIAGNOSIS — R51 Headache: Secondary | ICD-10-CM | POA: Insufficient documentation

## 2017-01-01 DIAGNOSIS — Y999 Unspecified external cause status: Secondary | ICD-10-CM | POA: Insufficient documentation

## 2017-01-01 DIAGNOSIS — Y92410 Unspecified street and highway as the place of occurrence of the external cause: Secondary | ICD-10-CM | POA: Insufficient documentation

## 2017-01-01 DIAGNOSIS — Y9389 Activity, other specified: Secondary | ICD-10-CM | POA: Diagnosis not present

## 2017-01-01 DIAGNOSIS — S0990XA Unspecified injury of head, initial encounter: Secondary | ICD-10-CM | POA: Diagnosis present

## 2017-01-01 DIAGNOSIS — M542 Cervicalgia: Secondary | ICD-10-CM | POA: Diagnosis not present

## 2017-01-01 LAB — PREGNANCY, URINE: Preg Test, Ur: NEGATIVE

## 2017-01-01 MED ORDER — NAPROXEN 375 MG PO TABS: 375.0000 mg | ORAL_TABLET | Freq: Two times a day (BID) | ORAL | 0 refills | Status: DC

## 2017-01-01 MED ORDER — HYDROCODONE-ACETAMINOPHEN 5-325 MG PO TABS
1.0000 | ORAL_TABLET | Freq: Once | ORAL | Status: AC
  Administered 2017-01-01: 1 via ORAL
  Filled 2017-01-01: qty 1

## 2017-01-01 MED ORDER — CYCLOBENZAPRINE HCL 10 MG PO TABS: 10.0000 mg | ORAL_TABLET | Freq: Two times a day (BID) | ORAL | 0 refills | Status: DC | PRN

## 2017-01-01 MED ORDER — KETOROLAC TROMETHAMINE 30 MG/ML IJ SOLN
15.0000 mg | Freq: Once | INTRAMUSCULAR | Status: AC
  Administered 2017-01-01: 15 mg via INTRAMUSCULAR
  Filled 2017-01-01: qty 1

## 2017-01-01 MED ORDER — CYCLOBENZAPRINE HCL 5 MG PO TABS
ORAL_TABLET | ORAL | Status: AC
  Filled 2017-01-01: qty 2

## 2017-01-01 MED ORDER — CYCLOBENZAPRINE HCL 5 MG PO TABS
7.5000 mg | ORAL_TABLET | Freq: Once | ORAL | Status: AC
  Administered 2017-01-01: 7.5 mg via ORAL

## 2017-01-01 NOTE — ED Notes (Signed)
Pt verbalizes understanding of d/c instructions and denies any further needs at this time. 
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
## 2017-01-01 NOTE — Discharge Instructions (Signed)
Imaging was normal. This is likely a musculoskeletal injury. Take the naproxen as needed for pain. Avoid any extra ibuprofen, Aleve, Advil, Motrin. Take the Flexeril for muscle relaxation. This medication makes her drowsy. She may also take Tylenol. Heating to the affected area. Warm soaks in Epsom salt. Make sure he follow up with a primary care doctor or return to the ED if symptoms are not improving.

## 2017-01-01 NOTE — ED Notes (Signed)
Pt returned from xray
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
## 2017-01-01 NOTE — ED Notes (Signed)
Patient transported to X-ray 

## 2019-06-24 ENCOUNTER — Emergency Department (HOSPITAL_COMMUNITY): Payer: 59

## 2019-06-24 ENCOUNTER — Emergency Department (HOSPITAL_COMMUNITY)
Admission: EM | Admit: 2019-06-24 | Discharge: 2019-06-24 | Disposition: A | Discharge status: Home / Self Care | Payer: 59 | Attending: Emergency Medicine | Admitting: Emergency Medicine

## 2019-06-24 ENCOUNTER — Telehealth: Payer: Self-pay

## 2019-06-24 ENCOUNTER — Other Ambulatory Visit: Payer: Self-pay

## 2019-06-24 DIAGNOSIS — Z79899 Other long term (current) drug therapy: Secondary | ICD-10-CM | POA: Insufficient documentation

## 2019-06-24 DIAGNOSIS — Y998 Other external cause status: Secondary | ICD-10-CM | POA: Diagnosis not present

## 2019-06-24 DIAGNOSIS — Y9289 Other specified places as the place of occurrence of the external cause: Secondary | ICD-10-CM | POA: Diagnosis not present

## 2019-06-24 DIAGNOSIS — W108XXA Fall (on) (from) other stairs and steps, initial encounter: Secondary | ICD-10-CM | POA: Insufficient documentation

## 2019-06-24 DIAGNOSIS — Y9301 Activity, walking, marching and hiking: Secondary | ICD-10-CM | POA: Diagnosis not present

## 2019-06-24 DIAGNOSIS — W19XXXA Unspecified fall, initial encounter: Secondary | ICD-10-CM

## 2019-06-24 DIAGNOSIS — S8991XA Unspecified injury of right lower leg, initial encounter: Secondary | ICD-10-CM | POA: Diagnosis present

## 2019-06-24 DIAGNOSIS — S82851A Displaced trimalleolar fracture of right lower leg, initial encounter for closed fracture: Secondary | ICD-10-CM | POA: Diagnosis not present

## 2019-06-24 MED ORDER — HYDROMORPHONE HCL 1 MG/ML IJ SOLN
0.5000 mg | Freq: Once | INTRAMUSCULAR | Status: AC
  Administered 2019-06-24: 0.5 mg via INTRAVENOUS
  Filled 2019-06-24: qty 1

## 2019-06-24 MED ORDER — PROPOFOL 10 MG/ML IV BOLUS
0.5000 mg/kg | Freq: Once | INTRAVENOUS | Status: DC
  Filled 2019-06-24: qty 20

## 2019-06-24 MED ORDER — HYDROMORPHONE HCL 1 MG/ML IJ SOLN
1.0000 mg | Freq: Once | INTRAMUSCULAR | Status: AC
  Administered 2019-06-24: 1 mg via INTRAVENOUS
  Filled 2019-06-24: qty 1

## 2019-06-24 MED ORDER — ONDANSETRON HCL 4 MG/2ML IJ SOLN
4.0000 mg | Freq: Once | INTRAMUSCULAR | Status: AC
  Administered 2019-06-24: 4 mg via INTRAVENOUS
  Filled 2019-06-24: qty 2

## 2019-06-24 MED ORDER — SODIUM CHLORIDE 0.9 % IV BOLUS
1000.0000 mL | Freq: Once | INTRAVENOUS | Status: AC
  Administered 2019-06-24: 13:00:00 1000 mL via INTRAVENOUS

## 2019-06-24 MED ORDER — OXYCODONE-ACETAMINOPHEN 5-325 MG PO TABS: 1.0000 | ORAL_TABLET | ORAL | 0 refills | Status: AC | PRN

## 2019-06-24 MED ORDER — PROPOFOL 10 MG/ML IV BOLUS
INTRAVENOUS | Status: AC | PRN
Start: 2019-06-24 — End: 2019-06-24
  Administered 2019-06-24 (×2): 50 mg via INTRAVENOUS
  Administered 2019-06-24 (×2): 20 mg via INTRAVENOUS
  Administered 2019-06-24: 30 mg via INTRAVENOUS

## 2019-06-24 NOTE — ED Triage Notes (Signed)
Per EMS: Patient fell down the stairs and her ankle twisted. Patient has PMS. Patient was given 50 mcgs of Fentanyl and patient did not tolerate well and her pressure decreased.  146/76 Last BP HR 80 RR 16 100% on RA 300 NS 20 g in right hand

## 2019-06-24 NOTE — Discharge Instructions (Signed)
Recommend taking Tylenol, Motrin for pain control.  For breakthrough pain, please take prescribed Percocet.  Please note this medicine can make you drowsy and should not be taken while driving or operating heavy machinery.  Please keep the splint clean and dry.  If you develop severe pain, skin discoloration, new ankle deformity, please return to ER or talk to your orthopedic doctor for quick reevaluation.  Please go to the appointment next week with your orthopedic doctor.  You will need surgery for your ankle.

## 2019-06-24 NOTE — ED Notes (Signed)
EDP at bedside  

## 2019-06-24 NOTE — ED Provider Notes (Signed)
.  Sedation  Date/Time: 06/24/2019 12:38 PM Performed by: Lennice Sites, DO Authorized by: Lennice Sites, DO   Consent:    Consent obtained:  Verbal   Consent given by:  Patient   Risks discussed:  Allergic reaction, dysrhythmia, inadequate sedation, nausea, prolonged hypoxia resulting in organ damage, prolonged sedation necessitating reversal, respiratory compromise necessitating ventilatory assistance and intubation and vomiting   Alternatives discussed:  Analgesia without sedation, anxiolysis and regional anesthesia Universal protocol:    Procedure explained and questions answered to patient or proxy's satisfaction: yes     Relevant documents present and verified: yes     Test results available and properly labeled: yes     Imaging studies available: yes     Required blood products, implants, devices, and special equipment available: yes     Site/side marked: yes     Immediately prior to procedure a time out was called: yes     Patient identity confirmation method:  Verbally with patient Indications:    Procedure necessitating sedation performed by:  Different physician Pre-sedation assessment:    Time since last food or drink:  12 hours   ASA classification: class 2 - patient with mild systemic disease     Neck mobility: normal     Mouth opening:  3 or more finger widths   Thyromental distance:  4 finger widths   Mallampati score:  I - soft palate, uvula, fauces, pillars visible   Pre-sedation assessments completed and reviewed: airway patency, cardiovascular function, hydration status, mental status, nausea/vomiting, pain level, respiratory function and temperature     Pre-sedation assessment completed:  06/24/2019 12:38 PM Immediate pre-procedure details:    Reassessment: Patient reassessed immediately prior to procedure     Reviewed: vital signs, relevant labs/tests and NPO status     Verified: bag valve mask available, emergency equipment available, intubation equipment  available, IV patency confirmed, oxygen available and suction available   Procedure details (see MAR for exact dosages):    Preoxygenation:  Nasal cannula   Sedation:  Propofol   Intended level of sedation: deep   Intra-procedure monitoring:  Blood pressure monitoring, cardiac monitor, continuous pulse oximetry, frequent LOC assessments, frequent vital sign checks and continuous capnometry   Intra-procedure events: none     Total Provider sedation time (minutes):  20 Post-procedure details:    Post-sedation assessment completed:  06/24/2019 1:25 PM   Attendance: Constant attendance by certified staff until patient recovered     Recovery: Patient returned to pre-procedure baseline     Post-sedation assessments completed and reviewed: airway patency, cardiovascular function, hydration status, mental status, nausea/vomiting, pain level, respiratory function and temperature     Patient is stable for discharge or admission: yes     Patient tolerance:  Tolerated well, no immediate complications  Sedation done for right ankle fracture.  Tolerated sedation well.    Lennice Sites, DO 06/24/19 1325

## 2019-06-24 NOTE — ED Notes (Signed)
D/c delayed by pain medication administration and attempting to get patient cleaned and in the car.

## 2019-06-24 NOTE — Telephone Encounter (Signed)
Patient needs appt with Dr Erlinda Hong. Called patient, husband answered and states he will ask wife to call us back.    * Needs appt with Dr Erlinda Hong for Tuesday.

## 2019-06-24 NOTE — ED Provider Notes (Signed)
[facility]-EMERGENCY DEPT Provider Note   CSN: [id number] Arrival date & time: 06/24/19  1829     History   Chief Complaint Chief Complaint  Patient presents with  . Fall  . Ankle Pain    HPI Angela Glenn is a 43 y.o. female.  Presents to ER with right ankle pain.  Patient states fell down stairs, twisting her right ankle, noted deformity.  Severe 10 out of 10 pain sharp, stabbing, worse with movement.  Denies associated injuries, denies hitting her head.  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      HPI  Past Medical History:  Diagnosis Date  . H/O varicella   . Scoliosis   . 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 Uveitis    uses steroid eye drops    Patient Active Problem List   Diagnosis Date Noted  . 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 LAPAROSCOPY       OB History    Gravida  3   Para  1   Term  1   Preterm      AB  2   Living  1     SAB  2   TAB      Ectopic      Multiple      Live Births  1            Home Medications    Prior to Admission medications   Medication Sig Start Date End Date Taking? Authorizing Provider  calcium acetate (PHOSLO) 667 MG capsule Take 667 mg by mouth 3 (three) times daily with meals.    [provider]  cyclobenzaprine (FLEXERIL) 10 MG tablet Take 1 tablet (10 mg total) by mouth 2 (two) times daily as needed for muscle spasms. 01/01/17   Rise Mu, PA-C  ferrous sulfate 325 (65 FE) MG tablet Take 325 mg by mouth daily with breakfast.    [provider]  folic acid (FOLVITE) 1 MG tablet Take 1 mg by mouth daily.    [provider]  naproxen (NAPROSYN) 375 MG tablet Take 1 tablet (375 mg total) by mouth 2 (two) times daily. 01/01/17   Rise Mu, PA-C  prednisoLONE acetate (PRED FORTE) 1 % ophthalmic suspension 1 drop 4 (four) times daily.     [provider]  Prenatal Vit-Fe Fumarate-FA (PRENATAL MULTIVITAMIN) TABS Take 1 tablet by mouth daily.    [provider]  promethazine (PHENERGAN) 25 MG tablet Take 1 tablet (25 mg total) by mouth every 6 (six) hours as needed for nausea or vomiting. 11/22/14   Lurene Shadow, PA-C    Family History Family History  Problem Relation Age of Onset  . Sjogren's syndrome Mother   . Hypertension Mother   . Arthritis Mother   . Anemia Mother   . Diabetes Father   . Heart attack Maternal Grandfather   . Hypertension Maternal Grandfather     Social History Social History   Tobacco Use  . Smoking status: Never Smoker  . Smokeless tobacco: Never Used  Substance Use Topics  . Alcohol use: No  . Drug use: No     Allergies   Shellfish allergy and Amoxicillin   Review of Systems Review of Systems  Constitutional: Negative for chills and fever.  HENT: Negative for ear pain and sore throat.   Eyes: Negative  for pain and visual disturbance.  Respiratory: Negative for cough and shortness of breath.   Cardiovascular: Negative for chest pain and palpitations.  Gastrointestinal: Negative for abdominal pain and vomiting.  Genitourinary: Negative for dysuria and hematuria.  Musculoskeletal: Positive for arthralgias. Negative for back pain.  Skin: Negative for color change and rash.  Neurological: Negative for seizures and syncope.  All other systems reviewed and are negative.    Physical Exam Updated Vital Signs BP (!) 136/94 (BP Location: Right Arm)   Pulse 72   Temp 98 F (36.7 C)   Resp 16   LMP 05/30/2019 (Approximate)   SpO2 100%   Physical Exam Vitals signs and nursing note reviewed.  Constitutional:      General: She is not in acute distress.    Appearance: She is well-developed.  HENT:     Head: Normocephalic and atraumatic.  Eyes:     Conjunctiva/sclera: Conjunctivae normal.  Neck:     Musculoskeletal: Neck supple.  Cardiovascular:      Rate and Rhythm: Normal rate and regular rhythm.     Heart sounds: No murmur.  Pulmonary:     Effort: Pulmonary effort is normal. No respiratory distress.     Breath sounds: Normal breath sounds.  Abdominal:     Palpations: Abdomen is soft.     Tenderness: There is no abdominal tenderness.  Musculoskeletal:     Comments: RLE: Obvious deformity to right ankle, exquisite tenderness over ankle, range of motion limited due to pain, DP pulse intact, distal sensation, cap refill intact, no tenderness palpation over knee, thigh, hip.  Skin:    General: Skin is warm and dry.  Neurological:     General: No focal deficit present.     Mental Status: She is alert and oriented to person, place, and time.      ED Treatments / Results  Labs (all labs ordered are listed, but only abnormal results are displayed) Labs Reviewed - No data to display  EKG None  Radiology No results found.  Procedures Reduction of fracture  Date/Time: 06/24/2019 5:31 PM Performed by: Milagros Lollykstra, Chana Lindstrom S, MD Authorized by: Milagros Lollykstra, Levar Fayson S, MD  Consent: Verbal consent obtained. Written consent obtained. Risks and benefits: risks, benefits and alternatives were discussed Consent given by: patient and spouse Patient understanding: patient states understanding of the procedure being performed Patient consent: the patient's understanding of the procedure matches consent given Procedure consent: procedure consent matches procedure scheduled Relevant documents: relevant documents present and verified Test results: test results available and properly labeled Site marked: the operative site was marked Imaging studies: imaging studies available Patient identity confirmed: verbally with patient, provided demographic data and arm band Time out: Immediately prior to procedure a "time out" was called to verify the correct patient, procedure, equipment, support staff and site/side marked as required. Local anesthesia  used: no  Anesthesia: Local anesthesia used: no  Sedation: Patient sedated: yes Sedation type: moderate (conscious) sedation Sedatives: see MAR for details and propofol  Comments: After conscious sedation initiated, patient adequately sedated, knee was partially flexed, applied distal traction to foot as well as slight dorsiflexion and foot inversion, achieved adequate reduction of fracture, anatomic appearance, post reduction DP pulses intact, sensation intact, motor intact, cap refill intact    (including critical care time)  Medications Ordered in ED Medications  HYDROmorphone (DILAUDID) injection 0.5 mg (has no administration in time range)     Initial Impression / Assessment and Plan / ED Course  I have reviewed  the triage vital signs and the nursing notes.  Pertinent labs & imaging results that were available during my care of the patient were reviewed by me and considered in my medical decision making (see chart for details).  Clinical Course as of Jun 23 1728  Wed Jun 24, 2019  1140 Paged ortho   [RD]  1200 Rechecked, updated on results and plan for ortho consultation   [RD]  [place] [facility]   [RD]  5397 Discussed with Erlinda Hong, recommends we attempt closed reduction in, requests contact after post reduction films   [RD]  1325 Completed closed reduction, reviewed post films, will notify Ortho   [RD]  1340 Discussed with Xu, reduction looks good per his review, requests CT for operative planning, here someone from his office will call patient to schedule surgery as out patient   [RD]    Clinical Course User Index [RD] Lucrezia Starch, MD       43 year old presented to ER after mechanical fall, ankle deformity.  Work-up concerning for displaced trimalleolar fracture.  Reviewed with on-call Ortho, Dr. Erlinda Hong.  Recommends reduction in ER.  Successful reduction.  Near anatomic alignment.  Post reduction neurovascularly intact.  CT ordered for operative planning.  Given Rx  for pain medicine, strict return precautions.  Ortho office coordinating likely surgery early next week.  After the discussed management above, the patient was determined to be safe for discharge.  The patient was in agreement with this plan and all questions regarding their care were answered.  ED return precautions were discussed and the patient will return to the ED with any significant worsening of condition.   Final Clinical Impressions(s) / ED Diagnoses   Final diagnoses:  Closed trimalleolar fracture of right ankle, initial encounter  Fall, initial encounter    ED Discharge Orders    None       Lucrezia Starch, MD 06/24/19 1733

## 2019-06-29 ENCOUNTER — Telehealth: Payer: Self-pay

## 2019-06-29 NOTE — Telephone Encounter (Signed)
I advised patient message below. She insists she would like a Callback from Dr Erlinda Hong. Please call patient ASAP. Thanks. Having a lot of pain also.

## 2019-06-29 NOTE — Telephone Encounter (Signed)
Patient called stating that she wasn't sure if she was suppose to see Dr. Erlinda Hong on Tuesday, 06/30/2019 or if she was suppose to have surgery.  I advised her that she has an appointment on 06/30/2019 with Dr. Erlinda Hong and that he is in clinic all day on Tuesday.   Patient stated that her right heel is hurting on and off since Thursday and she is feeling numbness in her right calf that started last night.  Cb# is [phone].  Please advise.  Thank you.

## 2019-06-29 NOTE — Telephone Encounter (Signed)
Yes she should come in tomorrow at her scheduled appt

## 2019-06-29 NOTE — Telephone Encounter (Signed)
  She should come in for OV 06/30/2019 correct.

## 2019-06-29 NOTE — Telephone Encounter (Signed)
Called and answered her questions.   

## 2019-06-30 ENCOUNTER — Other Ambulatory Visit: Payer: Self-pay

## 2019-06-30 ENCOUNTER — Encounter: Payer: Self-pay | Admitting: Orthopaedic Surgery

## 2019-06-30 ENCOUNTER — Other Ambulatory Visit (HOSPITAL_COMMUNITY)
Admission: RE | Admit: 2019-06-30 | Discharge: 2019-06-30 | Disposition: A | Discharge status: Home / Self Care | Payer: 59 | Source: Ambulatory Visit | Attending: Orthopaedic Surgery | Admitting: Orthopaedic Surgery

## 2019-06-30 ENCOUNTER — Ambulatory Visit (INDEPENDENT_AMBULATORY_CARE_PROVIDER_SITE_OTHER): Payer: 59 | Admitting: Orthopaedic Surgery

## 2019-06-30 ENCOUNTER — Encounter (HOSPITAL_BASED_OUTPATIENT_CLINIC_OR_DEPARTMENT_OTHER): Payer: Self-pay | Admitting: *Deleted

## 2019-06-30 DIAGNOSIS — Z01812 Encounter for preprocedural laboratory examination: Secondary | ICD-10-CM | POA: Insufficient documentation

## 2019-06-30 DIAGNOSIS — Z20828 Contact with and (suspected) exposure to other viral communicable diseases: Secondary | ICD-10-CM | POA: Insufficient documentation

## 2019-06-30 DIAGNOSIS — S82851A Displaced trimalleolar fracture of right lower leg, initial encounter for closed fracture: Secondary | ICD-10-CM | POA: Insufficient documentation

## 2019-06-30 LAB — SARS CORONAVIRUS 2 (TAT 6-24 HRS): SARS Coronavirus 2: NEGATIVE

## 2019-06-30 NOTE — Progress Notes (Signed)
Office Visit Note   Patient: Angela Glenn           Date of Birth: 1976-07-22           MRN: [id number] Visit Date: 06/30/2019              Requested by: No referring provider defined for this encounter. PCP: Patient, No Pcp Per   Assessment & Plan: Visit Diagnoses:  1. Closed trimalleolar fracture of ankle, right, initial encounter     Plan: Impression is displaced right trimalleolar ankle fracture dislocation.  X-rays and CT scan reviewed with the patient in detail and based on discussion patient agrees to proceed with surgical repair.  Risk benefits alternatives and rehab and recovery were all reviewed with patient in detail today.  Questions answered to her satisfaction.  The splint was reapplied today.  She will continue with elevation.  We will plan for surgery tomorrow. Total face to face encounter time was greater than 45 minutes and over half of this time was spent in counseling and/or coordination of care.  Follow-Up Instructions: Return in about 2 weeks (around 07/14/2019).   Orders:  No orders of the defined types were placed in this encounter.  No orders of the defined types were placed in this encounter.     Procedures: No procedures performed   Clinical Data: No additional findings.   Subjective: Chief Complaint  Patient presents with  . Right Ankle - Pain    [name] is a 43 year old female who is here for ER follow-up for a displaced right trimalleolar ankle fracture dislocation that she suffered last week.  This was successfully reduced in the ER under propofol sedation.  She is taking oxycodone aspirin.  She has been elevating.  She feels some throbbing burning and numbness and tingling in the calf and thigh.   Review of Systems  Constitutional: Negative.   HENT: Negative.   Eyes: Negative.   Respiratory: Negative.   Cardiovascular: Negative.   Endocrine: Negative.   Musculoskeletal: Negative.   Neurological: Negative.   Hematological:  Negative.   Psychiatric/Behavioral: Negative.   All other systems reviewed and are negative.    Objective: Vital Signs: LMP 06/14/2019   Physical Exam Vitals signs and nursing note reviewed.  Constitutional:      Appearance: She is well-developed.  HENT:     Head: Normocephalic and atraumatic.  Neck:     Musculoskeletal: Neck supple.  Pulmonary:     Effort: Pulmonary effort is normal.  Abdominal:     Palpations: Abdomen is soft.  Skin:    General: Skin is warm.     Capillary Refill: Capillary refill takes less than 2 seconds.  Neurological:     Mental Status: She is alert and oriented to person, place, and time.  Psychiatric:        Behavior: Behavior normal.        Thought Content: Thought content normal.        Judgment: Judgment normal.     Ortho Exam right lower extremity exam shows a fracture blister on the anterior medial aspect of the tibia.   Her skin does wrinkle with pinched.  Neurovascular intact.  She has moderate swelling.  Good distal pulses.  Specialty Comments:  No specialty comments available.  Imaging: No results found.   PMFS History: Patient Active Problem List   Diagnosis Date Noted  . Closed trimalleolar fracture of ankle, right, initial encounter 06/30/2019  . 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 H/O varicella   . Scoliosis   . 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 Uveitis    uses steroid eye drops    Family History  Problem Relation Age of Onset  . Sjogren's syndrome Mother   . Hypertension Mother   . Arthritis Mother   . Anemia Mother   . Diabetes Father   . Heart attack Maternal Grandfather   . Hypertension Maternal Grandfather     Past Surgical History:  Procedure Laterality Date  . LAPAROSCOPY     Social History   Occupational History  . Not on file  Tobacco Use  . Smoking status: Never Smoker  . Smokeless tobacco: Never Used  Substance and Sexual Activity  . Alcohol use: No  . Drug use: No   . Sexual activity: Yes

## 2019-07-01 ENCOUNTER — Encounter (HOSPITAL_BASED_OUTPATIENT_CLINIC_OR_DEPARTMENT_OTHER)
Admission: RE | Disposition: A | Discharge status: Home / Self Care | Payer: Self-pay | Source: Home / Self Care | Attending: Orthopaedic Surgery

## 2019-07-01 ENCOUNTER — Encounter (HOSPITAL_BASED_OUTPATIENT_CLINIC_OR_DEPARTMENT_OTHER): Payer: Self-pay | Admitting: Anesthesiology

## 2019-07-01 ENCOUNTER — Ambulatory Visit (HOSPITAL_BASED_OUTPATIENT_CLINIC_OR_DEPARTMENT_OTHER): Payer: 59 | Admitting: Anesthesiology

## 2019-07-01 ENCOUNTER — Ambulatory Visit (HOSPITAL_BASED_OUTPATIENT_CLINIC_OR_DEPARTMENT_OTHER)
Admission: RE | Admit: 2019-07-01 | Discharge: 2019-07-01 | Disposition: A | Discharge status: Home / Self Care | Payer: 59 | Attending: Orthopaedic Surgery | Admitting: Orthopaedic Surgery

## 2019-07-01 ENCOUNTER — Ambulatory Visit (HOSPITAL_COMMUNITY): Payer: 59

## 2019-07-01 ENCOUNTER — Other Ambulatory Visit: Payer: Self-pay

## 2019-07-01 DIAGNOSIS — Z880 Allergy status to penicillin: Secondary | ICD-10-CM | POA: Diagnosis not present

## 2019-07-01 DIAGNOSIS — H209 Unspecified iridocyclitis: Secondary | ICD-10-CM | POA: Insufficient documentation

## 2019-07-01 DIAGNOSIS — X58XXXA Exposure to other specified factors, initial encounter: Secondary | ICD-10-CM | POA: Insufficient documentation

## 2019-07-01 DIAGNOSIS — M419 Scoliosis, unspecified: Secondary | ICD-10-CM | POA: Insufficient documentation

## 2019-07-01 DIAGNOSIS — Z7982 Long term (current) use of aspirin: Secondary | ICD-10-CM | POA: Insufficient documentation

## 2019-07-01 DIAGNOSIS — S82851A Displaced trimalleolar fracture of right lower leg, initial encounter for closed fracture: Secondary | ICD-10-CM | POA: Diagnosis present

## 2019-07-01 DIAGNOSIS — Z419 Encounter for procedure for purposes other than remedying health state, unspecified: Secondary | ICD-10-CM

## 2019-07-01 HISTORY — PX: ORIF ANKLE FRACTURE: SHX5408

## 2019-07-01 LAB — POCT PREGNANCY, URINE: Preg Test, Ur: NEGATIVE

## 2019-07-01 SURGERY — OPEN REDUCTION INTERNAL FIXATION (ORIF) ANKLE FRACTURE
Anesthesia: General | Site: Ankle | Laterality: Right

## 2019-07-01 MED ORDER — PROMETHAZINE HCL 25 MG PO TABS: 25.0000 mg | ORAL_TABLET | Freq: Four times a day (QID) | ORAL | 1 refills | Status: AC | PRN

## 2019-07-01 MED ORDER — HYDROMORPHONE HCL 1 MG/ML IJ SOLN
INTRAMUSCULAR | Status: AC
  Filled 2019-07-01: qty 0.5

## 2019-07-01 MED ORDER — LIDOCAINE 2% (20 MG/ML) 5 ML SYRINGE
INTRAMUSCULAR | Status: AC
  Filled 2019-07-01: qty 5

## 2019-07-01 MED ORDER — FENTANYL CITRATE (PF) 100 MCG/2ML IJ SOLN
50.0000 ug | INTRAMUSCULAR | Status: DC | PRN
  Administered 2019-07-01: 100 ug via INTRAVENOUS

## 2019-07-01 MED ORDER — CALCIUM CARBONATE-VITAMIN D 500-200 MG-UNIT PO TABS: 1.0000 | ORAL_TABLET | Freq: Three times a day (TID) | ORAL | 6 refills | Status: AC

## 2019-07-01 MED ORDER — LACTATED RINGERS IV SOLN: INTRAVENOUS | Status: DC

## 2019-07-01 MED ORDER — CHLORHEXIDINE GLUCONATE 4 % EX LIQD: 60.0000 mL | Freq: Once | CUTANEOUS | Status: DC

## 2019-07-01 MED ORDER — EPHEDRINE SULFATE 50 MG/ML IJ SOLN
INTRAMUSCULAR | Status: DC | PRN
  Administered 2019-07-01: 10 mg via INTRAVENOUS

## 2019-07-01 MED ORDER — ZINC SULFATE 220 (50 ZN) MG PO CAPS: 220.0000 mg | ORAL_CAPSULE | Freq: Every day | ORAL | 0 refills | Status: AC

## 2019-07-01 MED ORDER — FENTANYL CITRATE (PF) 100 MCG/2ML IJ SOLN
INTRAMUSCULAR | Status: DC | PRN
  Administered 2019-07-01 (×2): 25 ug via INTRAVENOUS
  Administered 2019-07-01: 50 ug via INTRAVENOUS

## 2019-07-01 MED ORDER — LIDOCAINE-EPINEPHRINE (PF) 1.5 %-1:200000 IJ SOLN
INTRAMUSCULAR | Status: DC | PRN
  Administered 2019-07-01: 30 mL via PERINEURAL

## 2019-07-01 MED ORDER — DEXAMETHASONE SODIUM PHOSPHATE 10 MG/ML IJ SOLN
INTRAMUSCULAR | Status: AC
  Filled 2019-07-01: qty 1

## 2019-07-01 MED ORDER — SODIUM CHLORIDE (PF) 0.9 % IJ SOLN
INTRAMUSCULAR | Status: AC
  Filled 2019-07-01: qty 10

## 2019-07-01 MED ORDER — BUPIVACAINE-EPINEPHRINE (PF) 0.5% -1:200000 IJ SOLN
INTRAMUSCULAR | Status: DC | PRN
  Administered 2019-07-01: 30 mL via PERINEURAL

## 2019-07-01 MED ORDER — CEFAZOLIN SODIUM-DEXTROSE 2-4 GM/100ML-% IV SOLN
INTRAVENOUS | Status: AC
  Filled 2019-07-01: qty 100

## 2019-07-01 MED ORDER — MIDAZOLAM HCL 2 MG/2ML IJ SOLN
1.0000 mg | INTRAMUSCULAR | Status: DC | PRN
  Administered 2019-07-01: 2 mg via INTRAVENOUS

## 2019-07-01 MED ORDER — KETOROLAC TROMETHAMINE 10 MG PO TABS: 10.0000 mg | ORAL_TABLET | Freq: Two times a day (BID) | ORAL | 0 refills | Status: AC | PRN

## 2019-07-01 MED ORDER — LACTATED RINGERS IV SOLN
INTRAVENOUS | Status: DC
  Administered 2019-07-01 (×2): via INTRAVENOUS

## 2019-07-01 MED ORDER — EPHEDRINE 5 MG/ML INJ
INTRAVENOUS | Status: AC
  Filled 2019-07-01: qty 10

## 2019-07-01 MED ORDER — CEFAZOLIN SODIUM-DEXTROSE 2-4 GM/100ML-% IV SOLN
2.0000 g | INTRAVENOUS | Status: AC
  Administered 2019-07-01: 3 g via INTRAVENOUS

## 2019-07-01 MED ORDER — DEXAMETHASONE SODIUM PHOSPHATE 4 MG/ML IJ SOLN
INTRAMUSCULAR | Status: DC | PRN
  Administered 2019-07-01: 10 mg via INTRAVENOUS

## 2019-07-01 MED ORDER — OXYCODONE HCL 5 MG PO TABS: 5.0000 mg | ORAL_TABLET | Freq: Three times a day (TID) | ORAL | 0 refills | Status: AC | PRN

## 2019-07-01 MED ORDER — MIDAZOLAM HCL 2 MG/2ML IJ SOLN
INTRAMUSCULAR | Status: AC
  Filled 2019-07-01: qty 2

## 2019-07-01 MED ORDER — SENNOSIDES-DOCUSATE SODIUM 8.6-50 MG PO TABS: 1.0000 | ORAL_TABLET | Freq: Every evening | ORAL | 1 refills | Status: AC | PRN

## 2019-07-01 MED ORDER — METHOCARBAMOL 500 MG PO TABS: 500.0000 mg | ORAL_TABLET | Freq: Four times a day (QID) | ORAL | 2 refills | Status: DC | PRN

## 2019-07-01 MED ORDER — ONDANSETRON HCL 4 MG/2ML IJ SOLN: 4.0000 mg | Freq: Once | INTRAMUSCULAR | Status: DC | PRN

## 2019-07-01 MED ORDER — HYDROMORPHONE HCL 1 MG/ML IJ SOLN
0.2500 mg | INTRAMUSCULAR | Status: DC | PRN
  Administered 2019-07-01 (×2): 0.5 mg via INTRAVENOUS

## 2019-07-01 MED ORDER — FENTANYL CITRATE (PF) 100 MCG/2ML IJ SOLN
INTRAMUSCULAR | Status: AC
  Filled 2019-07-01: qty 2

## 2019-07-01 MED ORDER — PROPOFOL 10 MG/ML IV BOLUS
INTRAVENOUS | Status: DC | PRN
  Administered 2019-07-01: 100 mg via INTRAVENOUS

## 2019-07-01 MED ORDER — PROPOFOL 10 MG/ML IV BOLUS
INTRAVENOUS | Status: AC
  Filled 2019-07-01: qty 20

## 2019-07-01 MED ORDER — LIDOCAINE 2% (20 MG/ML) 5 ML SYRINGE
INTRAMUSCULAR | Status: DC | PRN
  Administered 2019-07-01: 100 mg via INTRAVENOUS

## 2019-07-01 MED ORDER — MEPERIDINE HCL 25 MG/ML IJ SOLN: 6.2500 mg | INTRAMUSCULAR | Status: DC | PRN

## 2019-07-01 MED ORDER — CEFAZOLIN SODIUM 1 G IJ SOLR
INTRAMUSCULAR | Status: AC
  Filled 2019-07-01: qty 10

## 2019-07-01 MED ORDER — ONDANSETRON HCL 4 MG/2ML IJ SOLN
INTRAMUSCULAR | Status: AC
  Filled 2019-07-01: qty 2

## 2019-07-01 MED ORDER — ONDANSETRON HCL 4 MG/2ML IJ SOLN
INTRAMUSCULAR | Status: DC | PRN
  Administered 2019-07-01: 4 mg via INTRAVENOUS

## 2019-07-01 SURGICAL SUPPLY — 105 items
BANDAGE ESMARK 6X9 LF (GAUZE/BANDAGES/DRESSINGS) ×1 IMPLANT
BIT DRILL 1.8 (BIT) ×1
BIT DRILL 125X2.7XAO QCK (BIT) IMPLANT
BIT DRILL 137X1.8XAO QCK (BIT) IMPLANT
BIT DRILL 2.7 (BIT) ×1
BIT DRILL CANN QC 2.85X190 (BIT) ×1 IMPLANT
BIT DRILL QC ANT 3.5X110 (BIT) ×1 IMPLANT
BIT DRL 125X2.7XAO QCK (BIT) ×1
BIT DRL 137X1.8XAO QCK (BIT) ×1
BLADE HEX COATED 2.75 (ELECTRODE) ×2 IMPLANT
BLADE SURG 15 STRL LF DISP TIS (BLADE) ×2 IMPLANT
BLADE SURG 15 STRL SS (BLADE) ×4
BNDG COHESIVE 6X5 TAN STRL LF (GAUZE/BANDAGES/DRESSINGS) ×2 IMPLANT
BNDG ELASTIC 4X5.8 VLCR STR LF (GAUZE/BANDAGES/DRESSINGS) ×1 IMPLANT
BNDG ELASTIC 6X5.8 VLCR STR LF (GAUZE/BANDAGES/DRESSINGS) ×2 IMPLANT
BNDG ESMARK 6X9 LF (GAUZE/BANDAGES/DRESSINGS) ×2
BRUSH SCRUB EZ PLAIN DRY (MISCELLANEOUS) ×2 IMPLANT
CANISTER SUCT 1200ML W/VALVE (MISCELLANEOUS) ×2 IMPLANT
COVER BACK TABLE REUSABLE LG (DRAPES) ×2 IMPLANT
COVER MAYO STAND REUSABLE (DRAPES) IMPLANT
COVER WAND RF STERILE (DRAPES) IMPLANT
CUFF TOURN SGL QUICK 34 (TOURNIQUET CUFF)
CUFF TOURN SGL QUICK 42 (TOURNIQUET CUFF) ×1 IMPLANT
CUFF TRNQT CYL 34X4.125X (TOURNIQUET CUFF) IMPLANT
DECANTER SPIKE VIAL GLASS SM (MISCELLANEOUS) IMPLANT
DRAPE C-ARM 42X72 X-RAY (DRAPES) ×2 IMPLANT
DRAPE C-ARMOR (DRAPES) ×2 IMPLANT
DRAPE EXTREMITY T 121X128X90 (DISPOSABLE) ×2 IMPLANT
DRAPE HALF SHEET 70X43 (DRAPES) ×2 IMPLANT
DRAPE IMP U-DRAPE 54X76 (DRAPES) ×2 IMPLANT
DRAPE INCISE IOBAN 66X45 STRL (DRAPES) IMPLANT
DRAPE OEC MINIVIEW 54X84 (DRAPES) IMPLANT
DRAPE SURG 17X23 STRL (DRAPES) ×2 IMPLANT
DRAPE U-SHAPE 47X51 STRL (DRAPES) ×1 IMPLANT
DRSG PAD ABDOMINAL 8X10 ST (GAUZE/BANDAGES/DRESSINGS) ×7 IMPLANT
DURAPREP 26ML APPLICATOR (WOUND CARE) ×5 IMPLANT
ELECT REM PT RETURN 9FT ADLT (ELECTROSURGICAL) ×2
ELECTRODE REM PT RTRN 9FT ADLT (ELECTROSURGICAL) ×1 IMPLANT
GAUZE SPONGE 4X4 12PLY STRL (GAUZE/BANDAGES/DRESSINGS) ×3 IMPLANT
GAUZE XEROFORM 1X8 LF (GAUZE/BANDAGES/DRESSINGS) ×2 IMPLANT
GAUZE XEROFORM 5X9 LF (GAUZE/BANDAGES/DRESSINGS) ×1 IMPLANT
GLOVE BIOGEL PI IND STRL 7.0 (GLOVE) ×1 IMPLANT
GLOVE BIOGEL PI INDICATOR 7.0 (GLOVE) ×1
GLOVE ECLIPSE 7.0 STRL STRAW (GLOVE) ×2 IMPLANT
GLOVE SKINSENSE NS SZ7.5 (GLOVE) ×1
GLOVE SKINSENSE STRL SZ7.5 (GLOVE) ×1 IMPLANT
GLOVE SURG SYN 7.5  E (GLOVE) ×1
GLOVE SURG SYN 7.5 E (GLOVE) ×1 IMPLANT
GLOVE SURG SYN 7.5 PF PI (GLOVE) ×1 IMPLANT
GOWN STRL REIN XL XLG (GOWN DISPOSABLE) ×2 IMPLANT
GOWN STRL REUS W/ TWL LRG LVL3 (GOWN DISPOSABLE) ×1 IMPLANT
GOWN STRL REUS W/ TWL XL LVL3 (GOWN DISPOSABLE) ×1 IMPLANT
GOWN STRL REUS W/TWL LRG LVL3 (GOWN DISPOSABLE) ×1
GOWN STRL REUS W/TWL XL LVL3 (GOWN DISPOSABLE) ×1
K-WIRE 1.6 (WIRE) ×2
K-WIRE FX150X1.6XTROC TIP (WIRE) ×2
K-WIRE TROCAR TIP 1.4X150 (WIRE) ×4
KWIRE FX 150X1.6 TROC TIP (WIRE) IMPLANT
KWIRE TROCAR TIP 1.4X150 (WIRE) IMPLANT
MANIFOLD NEPTUNE II (INSTRUMENTS) ×2 IMPLANT
NEEDLE HYPO 22GX1.5 SAFETY (NEEDLE) IMPLANT
NS IRRIG 1000ML POUR BTL (IV SOLUTION) ×2 IMPLANT
PACK BASIN DAY SURGERY FS (CUSTOM PROCEDURE TRAY) ×2 IMPLANT
PAD CAST 3X4 CTTN HI CHSV (CAST SUPPLIES) IMPLANT
PAD CAST 4YDX4 CTTN HI CHSV (CAST SUPPLIES) IMPLANT
PADDING CAST COTTON 3X4 STRL (CAST SUPPLIES)
PADDING CAST COTTON 4X4 STRL (CAST SUPPLIES) ×1
PADDING CAST COTTON 6X4 STRL (CAST SUPPLIES) ×1 IMPLANT
PADDING CAST SYN 6 (CAST SUPPLIES) ×1
PADDING CAST SYNTHETIC 4 (CAST SUPPLIES) ×4
PADDING CAST SYNTHETIC 4X4 STR (CAST SUPPLIES) ×1 IMPLANT
PADDING CAST SYNTHETIC 6X4 NS (CAST SUPPLIES) ×1 IMPLANT
PENCIL SMOKE EVACUATOR (MISCELLANEOUS) ×2 IMPLANT
PLATE FIB ANTHEM RT 9H 152 (Plate) ×1 IMPLANT
SCREW CANN LT CAPT 4X36 (Screw) ×2 IMPLANT
SCREW CORTEX 3.5X14MM (Screw) ×3 IMPLANT
SCREW CORTEX 3.5X16MM (Screw) ×1 IMPLANT
SCREW LOCK MONO 2.5X18 NS (Screw) IMPLANT
SCREW LOCK MONOAX 2.5X16 (Screw) ×2 IMPLANT
SCREW LOCKING 2.5X18MM (Screw) ×1 IMPLANT
SCREW NONLOCK ANT 2.5X18 (Screw) ×1 IMPLANT
SCREW NONLOCK ANT SS 3.5X18 (Screw) ×2 IMPLANT
SCREW NONLOCK ANT SS 3.5X20 (Screw) ×1 IMPLANT
SLEEVE SCD COMPRESS KNEE MED (MISCELLANEOUS) ×2 IMPLANT
SPLINT FIBERGLASS 4X30 (CAST SUPPLIES) ×1 IMPLANT
SPONGE LAP 18X18 RF (DISPOSABLE) ×2 IMPLANT
STOCKINETTE TUBULAR 6 INCH (GAUZE/BANDAGES/DRESSINGS) ×1 IMPLANT
SUCTION FRAZIER HANDLE 10FR (MISCELLANEOUS) ×1
SUCTION TUBE FRAZIER 10FR DISP (MISCELLANEOUS) ×1 IMPLANT
SUT ETHILON 3 0 PS 1 (SUTURE) ×3 IMPLANT
SUT VIC AB 0 CT1 27 (SUTURE) ×2
SUT VIC AB 0 CT1 27XBRD ANBCTR (SUTURE) IMPLANT
SUT VIC AB 2-0 CT1 27 (SUTURE) ×4
SUT VIC AB 2-0 CT1 TAPERPNT 27 (SUTURE) ×1 IMPLANT
SUT VIC AB 2-0 SH 27 (SUTURE) ×1
SUT VIC AB 2-0 SH 27XBRD (SUTURE) IMPLANT
SUT VIC AB 3-0 SH 27 (SUTURE)
SUT VIC AB 3-0 SH 27X BRD (SUTURE) IMPLANT
SYR BULB 3OZ (MISCELLANEOUS) ×2 IMPLANT
SYR CONTROL 10ML LL (SYRINGE) IMPLANT
TOWEL GREEN STERILE FF (TOWEL DISPOSABLE) ×3 IMPLANT
TRAY DSU PREP LF (CUSTOM PROCEDURE TRAY) ×2 IMPLANT
TUBE CONNECTING 20X1/4 (TUBING) ×2 IMPLANT
UNDERPAD 30X36 HEAVY ABSORB (UNDERPADS AND DIAPERS) ×2 IMPLANT
YANKAUER SUCT BULB TIP NO VENT (SUCTIONS) ×2 IMPLANT

## 2019-07-01 NOTE — H&P (Signed)
PREOPERATIVE H&P  Chief Complaint: right trimalleolar ankle fracture  HPI: Angela Glenn is a 43 y.o. female who presents for surgical treatment of right trimalleolar ankle fracture.  She denies any changes in medical history.  Past Medical History:  Diagnosis Date  . H/O varicella   . Scoliosis   . 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 Uveitis    uses steroid eye drops   Past Surgical History:  Procedure Laterality Date  . LAPAROSCOPY     Social History   Socioeconomic History  . Marital status: Married    Spouse name: Not on file  . Number of children: Not on file  . Years of education: Not on file  . Highest education level: Not on file  Occupational History  . Not on file  Social Needs  . Financial resource strain: Not on file  . Food insecurity    Worry: Not on file    Inability: Not on file  . Transportation needs    Medical: Not on file    Non-medical: Not on file  Tobacco Use  . Smoking status: Never Smoker  . Smokeless tobacco: Never Used  Substance and Sexual Activity  . Alcohol use: No  . Drug use: No  . Sexual activity: Yes  Lifestyle  . Physical activity    Days per week: Not on file    Minutes per session: Not on file  . Stress: Not on file  Relationships  . Social Herbalist on phone: Not on file    Gets together: Not on file    Attends religious service: Not on file    Active member of club or organization: Not on file    Attends meetings of clubs or organizations: Not on file    Relationship status: Not on file  Other Topics Concern  . Not on file  Social History Narrative  . Not on file   Family History  Problem Relation Age of Onset  . Sjogren's syndrome Mother   . Hypertension Mother   . Arthritis Mother   . Anemia Mother   . Diabetes Father   . Heart attack Maternal Grandfather   . Hypertension Maternal Grandfather    Allergies  Allergen Reactions  . Shellfish Allergy Anaphylaxis    Mollusks only   . Amoxicillin  Nausea And Vomiting   Prior to Admission medications   Medication Sig Start Date End Date Taking? Authorizing Provider  aspirin EC 81 MG tablet Take 81 mg by mouth daily.   Yes [provider]  ibuprofen (ADVIL) 800 MG tablet Take 800 mg by mouth every 8 (eight) hours as needed.   Yes [provider]  oxyCODONE-acetaminophen (PERCOCET/ROXICET) 5-325 MG tablet Take by mouth every 4 (four) hours as needed for severe pain.   Yes [provider]  prednisoLONE acetate (PRED FORTE) 1 % ophthalmic suspension 1 drop 4 (four) times daily.    [provider]     Positive ROS: All other systems have been reviewed and were otherwise negative with the exception of those mentioned in the HPI and as above.  Physical Exam: General: Alert, no acute distress Cardiovascular: No pedal edema Respiratory: No cyanosis, no use of accessory musculature GI: abdomen soft Skin: No lesions in the area of chief complaint Neurologic: Sensation intact distally Psychiatric: Patient is competent for consent with normal mood and affect Lymphatic: no lymphedema  MUSCULOSKELETAL: exam stable  Assessment: right trimalleolar ankle fracture  Plan: Plan for Procedure(s):  OPEN REDUCTION INTERNAL FIXATION (ORIF) RIGHT TRIMALLEOLAR ANKLE FRACTURE  The risks benefits and alternatives were discussed with the patient including but not limited to the risks of nonoperative treatment, versus surgical intervention including infection, bleeding, nerve injury,  blood clots, cardiopulmonary complications, morbidity, mortality, among others, and they were willing to proceed.   Glee Arvin, MD   07/01/2019 8:29 AM

## 2019-07-01 NOTE — Discharge Instructions (Signed)
    1. Keep splint clean and dry 2. Elevate foot above level of the heart 3. Take aspirin to prevent blood clots 4. Take pain meds as needed 5. Strict non weight bearing to operative extremity Regional Anesthesia Blocks  1. Numbness or the inability to move the "blocked" extremity may last from 3-48 hours after placement. The length of time depends on the medication injected and your individual response to the medication. If the numbness is not going away after 48 hours, call your surgeon.  2. The extremity that is blocked will need to be protected until the numbness is gone and the  Strength has returned. Because you cannot feel it, you will need to take extra care to avoid injury. Because it may be weak, you may have difficulty moving it or using it. You may not know what position it is in without looking at it while the block is in effect.  3. For blocks in the legs and feet, returning to weight bearing and walking needs to be done carefully. You will need to wait until the numbness is entirely gone and the strength has returned. You should be able to move your leg and foot normally before you try and bear weight or walk. You will need someone to be with you when you first try to ensure you do not fall and possibly risk injury.  4. Bruising and tenderness at the needle site are common side effects and will resolve in a few days.  5. Persistent numbness or new problems with movement should be communicated to the surgeon or the [facility] ([phone])/ [facility] ([phone]).   Post Anesthesia Home Care Instructions  Activity: Get plenty of rest for the remainder of the day. A responsible individual must stay with you for 24 hours following the procedure.  For the next 24 hours, DO NOT: -Drive a car -Operate machinery -Drink alcoholic beverages -Take any medication unless instructed by your physician -Make any legal decisions or sign important  papers.  Meals: Start with liquid foods such as gelatin or soup. Progress to regular foods as tolerated. Avoid greasy, spicy, heavy foods. If nausea and/or vomiting occur, drink only clear liquids until the nausea and/or vomiting subsides. Call your physician if vomiting continues.  Special Instructions/Symptoms: Your throat may feel dry or sore from the anesthesia or the breathing tube placed in your throat during surgery. If this causes discomfort, gargle with warm salt water. The discomfort should disappear within 24 hours.  If you had a scopolamine patch placed behind your ear for the management of post- operative nausea and/or vomiting:  1. The medication in the patch is effective for 72 hours, after which it should be removed.  Wrap patch in a tissue and discard in the trash. Wash hands thoroughly with soap and water. 2. You may remove the patch earlier than 72 hours if you experience unpleasant side effects which may include dry mouth, dizziness or visual disturbances. 3. Avoid touching the patch. Wash your hands with soap and water after contact with the patch.     

## 2019-07-01 NOTE — Transfer of Care (Signed)
Immediate Anesthesia Transfer of Care Note  Patient: Angela Glenn  Procedure(s) Performed: OPEN REDUCTION INTERNAL FIXATION (ORIF) RIGHT TRIMALLEOLAR ANKLE FRACTURE (Right Ankle)  Patient Location: PACU  Anesthesia Type:GA combined with regional for post-op pain  Level of Consciousness: awake  Airway & Oxygen Therapy: Patient Spontanous Breathing and Patient connected to face mask oxygen  Post-op Assessment: Report given to RN and Post -op Vital signs reviewed and stable  Post vital signs: Reviewed and stable  Last Vitals:  Vitals Value Taken Time  BP    Temp    Pulse 104 07/01/19 1251  Resp 19 07/01/19 1251  SpO2 99 % 07/01/19 1251  Vitals shown include unvalidated device data.  Last Pain:  Vitals:   07/01/19 0918  PainSc: 6       Patients Stated Pain Goal: 3 ([id number])  Complications: No apparent anesthesia complications

## 2019-07-01 NOTE — Anesthesia Preprocedure Evaluation (Signed)
Anesthesia Evaluation  Patient identified by MRN, date of birth, ID band Patient awake    Reviewed: Allergy & Precautions, NPO status , Patient's Chart, lab work & pertinent test results  Airway Mallampati: I  TM Distance: >3 FB Neck ROM: Full    Dental   Pulmonary    Pulmonary exam normal        Cardiovascular Normal cardiovascular exam     Neuro/Psych    GI/Hepatic   Endo/Other    Renal/GU      Musculoskeletal   Abdominal   Peds  Hematology   Anesthesia Other Findings   Reproductive/Obstetrics                             Anesthesia Physical Anesthesia Plan  ASA: II  Anesthesia Plan: General   Post-op Pain Management:  Regional for Post-op pain   Induction:   PONV Risk Score and Plan: 3 and Ondansetron, Midazolam and Dexamethasone  Airway Management Planned: LMA  Additional Equipment:   Intra-op Plan:   Post-operative Plan: Extubation in OR  Informed Consent: I have reviewed the patients History and Physical, chart, labs and discussed the procedure including the risks, benefits and alternatives for the proposed anesthesia with the patient or authorized representative who has indicated his/her understanding and acceptance.       Plan Discussed with: CRNA and Surgeon  Anesthesia Plan Comments:         Anesthesia Quick Evaluation  

## 2019-07-01 NOTE — Op Note (Signed)
Date of Surgery: 07/01/2019  INDICATIONS: Angela Glenn is a 43 y.o.-year-old female who sustained a right ankle fracture; she was indicated for open reduction and internal fixation due to the displaced nature of the articular fracture and came to the operating room today for this procedure. The patient did consent to the procedure after discussion of the risks and benefits.  PREOPERATIVE DIAGNOSIS: right trimalleolar ankle fracture  POSTOPERATIVE DIAGNOSIS: Same.  PROCEDURE: Open treatment of right ankle fracture with internal fixation. Trimalleolar w/o fixation of posterior malleolus CPT 27822  SURGEON: N. Eduard Roux, M.D.  ASSIST: Angela Glenn, Angela Glenn; necessary for the timely completion of procedure and due to complexity of procedure.  ANESTHESIA:  general, popliteal block  TOURNIQUET TIME: less than 60 mins  IV FLUIDS AND URINE: See anesthesia.  ESTIMATED BLOOD LOSS: minimal mL.  IMPLANTS: Globus distal fibula plate  COMPLICATIONS: see description of procedure.  DESCRIPTION OF PROCEDURE: The patient was brought to the operating room and placed supine on the operating table.  The patient had been signed prior to the procedure and this was documented. The patient had the anesthesia placed by the anesthesiologist.  A nonsterile tourniquet was placed on the upper thigh.  The prep verification and incision time-outs were performed to confirm that this was the correct patient, site, side and location. The patient had an SCD on the opposite lower extremity. The patient did receive antibiotics prior to the incision and was re-dosed during the procedure as needed at indicated intervals.  The patient had the lower extremity prepped and draped in the standard surgical fashion.  The extremity was exsanguinated using an esmarch bandage and the tourniquet was inflated to 300 mm Hg.  An incision was made on the lateral aspect of the ankle.  Dissection was carried down through the subcutaneous  tissue onto the fascia which was then sharply incised in line with the incision.  The fracture was then exposed and retractors were placed.  She had a long oblique fracture that was reduced and clamped.  A posterior to anterior lag screw was placed using standard AO technique with excellent fixation and purchase.  The clamp was removed and the fracture remained reduced.  We then placed a long precontoured fibular plate on the side of the fibula using fluoroscopic guidance.  Bicortical nonlocking and unicortical locking screws were placed through the plate each with excellent fixation into the fibula.  After this was done I then made a separate incision on the medial side of his ankle over the medial malleolus.  Fortunately the fracture blister was proximal to this incision.  Dissection was carried down through the subcutaneous tissue.  Saphenous structures were mobilized posteriorly.  The fracture was visualized and periosteum was removed from the fracture site as well as a small piece of the articular anterior medial distal tibia that had fracture off.  The joint was then evaluated and lavaged.  The fracture was then reduced and clamped with a tenaculum.  2 parallel K wires were advanced across the fracture using fluoroscopic guidance and cannulated partially-threaded screws were placed over the wire each with excellent fixation.  The clamp was removed.  Stress test of the ankle showed no widening of the medial clear space.  The surgical wounds were thoroughly irrigated.  Layer closure was performed.  Sterile dressings were applied.  Patient tolerated the procedure well had no immediate complications.  POSTOPERATIVE PLAN: Angela Glenn will remain nonweightbearing on this leg for approximately 6 weeks; Angela Glenn will return  for suture removal in 2 weeks.  He will be immobilized in a short leg splint and then transitioned to a CAM walker at his first follow up appointment.  Angela Glenn will receive DVT prophylaxis  based on other medications, activity level, and risk ratio of bleeding to thrombosis.  Angela Reel, MD [facility] 12:11 PM

## 2019-07-01 NOTE — Anesthesia Procedure Notes (Signed)
Anesthesia Regional Block: Popliteal block   Pre-Anesthetic Checklist: ,, timeout performed, Correct Patient, Correct Site, Correct Laterality, Correct Procedure, Correct Position, site marked, Risks and benefits discussed,  Surgical consent,  Pre-op evaluation,  At surgeon's request and post-op pain management  Laterality: Right  Prep: chloraprep       Needles:  Injection technique: Single-shot  Needle Type: Echogenic Stimulator Needle     Needle Length: 10cm  Needle Gauge: 21     Additional Needles:   Procedures:, nerve stimulator,,,,,,,   Nerve Stimulator or Paresthesia:  Response: 0.4 mA,   Additional Responses:   Narrative:  Start time: 07/01/2019 9:47 AM End time: 07/01/2019 9:57 AM Injection made incrementally with aspirations every 5 mL.  Performed by: Personally  Anesthesiologist: Lillia Abed, MD  Additional Notes: Monitors applied. Patient sedated. Sterile prep and drape,hand hygiene and sterile gloves were used. Relevant anatomy identified.Needle position confirmed.Local anesthetic injected incrementally after negative aspiration. Local anesthetic spread visualized around nerve(s). Vascular puncture avoided. No complications. Image printed for medical record.The patient tolerated the procedure well.  Additional Saphenous nerve block performed. 15cc Local Anesthetic mixture placed under ultrasonic guidance along the medio-inferior border of the Sartorious muscle 6 inches above the knee.  No Problems encountered.  Lillia Abed MD

## 2019-07-01 NOTE — Progress Notes (Signed)
Assisted Dr. Ossey with right, ultrasound guided, popliteal/saphenous block. Side rails up, monitors on throughout procedure. See vital signs in flow sheet. Tolerated Procedure well. 

## 2019-07-01 NOTE — Anesthesia Procedure Notes (Signed)
Procedure Name: LMA Insertion Date/Time: 07/01/2019 10:28 AM Performed by: Marrianne Mood, CRNA Pre-anesthesia Checklist: Patient identified, Emergency Drugs available, Suction available, Patient being monitored and Timeout performed Patient Re-evaluated:Patient Re-evaluated prior to induction Oxygen Delivery Method: Circle system utilized Preoxygenation: Pre-oxygenation with 100% oxygen Induction Type: IV induction Ventilation: Mask ventilation without difficulty LMA: LMA inserted LMA Size: 4.0 Number of attempts: 1 Airway Equipment and Method: Bite block Placement Confirmation: positive ETCO2 Tube secured with: Tape Dental Injury: Teeth and Oropharynx as per pre-operative assessment

## 2019-07-01 NOTE — Anesthesia Postprocedure Evaluation (Signed)
Anesthesia Post Note  Patient: Angela Glenn  Procedure(s) Performed: OPEN REDUCTION INTERNAL FIXATION (ORIF) RIGHT TRIMALLEOLAR ANKLE FRACTURE (Right Ankle)     Patient location during evaluation: PACU Anesthesia Type: General Level of consciousness: awake and alert Pain management: pain level controlled Vital Signs Assessment: post-procedure vital signs reviewed and stable Respiratory status: spontaneous breathing, nonlabored ventilation, respiratory function stable and patient connected to nasal cannula oxygen Cardiovascular status: blood pressure returned to baseline and stable Postop Assessment: no apparent nausea or vomiting Anesthetic complications: no    Last Vitals:  Vitals:   07/01/19 1330 07/01/19 1345  BP: 124/87 121/81  Pulse: 96 87  Resp: 11 15  Temp:    SpO2: 99% 98%    Last Pain:  Vitals:   07/01/19 1345  PainSc: 2                  Pride Gonzales DAVID

## 2019-07-02 ENCOUNTER — Encounter (HOSPITAL_BASED_OUTPATIENT_CLINIC_OR_DEPARTMENT_OTHER): Payer: Self-pay | Admitting: Orthopaedic Surgery

## 2019-07-09 ENCOUNTER — Telehealth: Payer: Self-pay

## 2019-07-09 NOTE — Telephone Encounter (Signed)
Yes that is likely due to the nerves being compressed by the swelling.  Normal to have that.

## 2019-07-09 NOTE — Telephone Encounter (Signed)
Patient called stating that she is having pain on the top of her right foot that feels like an electric shock that radiates up to her right calf.  Patient had ORIF, right ankle on 07/01/2019.  Would like to know if this is normal or does she need to be seen?  Cb# [phone].  Please advise.  Thank you.

## 2019-07-09 NOTE — Telephone Encounter (Signed)
See message.

## 2019-07-10 NOTE — Telephone Encounter (Signed)
Called patient to advise. She states she is Taking Percocet one a day usually during night , methocarbamol twice a day. Would like to know what else she can take in between. Tylenol/advil?

## 2019-07-10 NOTE — Telephone Encounter (Signed)
Called to advise on message.  

## 2019-07-10 NOTE — Telephone Encounter (Signed)
Advil 800 mg TID

## 2019-07-15 ENCOUNTER — Ambulatory Visit (INDEPENDENT_AMBULATORY_CARE_PROVIDER_SITE_OTHER): Payer: 59

## 2019-07-15 ENCOUNTER — Encounter: Payer: Self-pay | Admitting: Orthopaedic Surgery

## 2019-07-15 ENCOUNTER — Other Ambulatory Visit: Payer: Self-pay

## 2019-07-15 ENCOUNTER — Ambulatory Visit (INDEPENDENT_AMBULATORY_CARE_PROVIDER_SITE_OTHER): Payer: 59 | Admitting: Orthopaedic Surgery

## 2019-07-15 DIAGNOSIS — M25571 Pain in right ankle and joints of right foot: Secondary | ICD-10-CM

## 2019-07-15 DIAGNOSIS — S82851A Displaced trimalleolar fracture of right lower leg, initial encounter for closed fracture: Secondary | ICD-10-CM

## 2019-07-15 MED ORDER — OXYCODONE-ACETAMINOPHEN 5-325 MG PO TABS: 1.0000 | ORAL_TABLET | Freq: Two times a day (BID) | ORAL | 0 refills | Status: DC | PRN

## 2019-07-15 NOTE — Progress Notes (Signed)
   Post-Op Visit Note   Patient: Angela Glenn           Date of Birth: 1975-10-18           MRN: [id number] Visit Date: 07/15/2019 PCP: Patient, No Pcp Per   Assessment & Plan:  Chief Complaint:  Chief Complaint  Patient presents with  . Right Ankle - Follow-up   Visit Diagnoses:  1. Closed trimalleolar fracture of ankle, right, initial encounter   2. Pain in right ankle and joints of right foot     Plan: Patient is a pleasant 43 year old female who presents our clinic today 2 weeks status post ORIF right trimalleolar ankle fracture, date of surgery 07/01/2019.  She has been doing well.  She has been compliant nonweightbearing in her short leg splint.  She has been elevating as often as possible.  No fevers or chills.  Examination of her right ankle reveals well-healing surgical incisions with nylon sutures in place.  Her medial fracture blisters have scabbed over.  No evidence of infection or cellulitis.  Calf is soft and nontender.  She does have moderate swelling throughout.  She is neurovascular intact distally.  Today, nylon sutures were removed and Steri-Strips applied.  Xeroform applied to the medial fracture blisters.  We will transition her into a cam walker.  She will remain nonweightbearing for another 4 weeks.  She will continue to elevate for pain and swelling.  I will refill her Percocet.  She will follow-up with [place] in 4 weeks time for repeat evaluation three-view x-rays of the right ankle.  Follow-Up Instructions: Return in about 4 weeks (around 08/12/2019).   Orders:  Orders Placed This Encounter  Procedures  . XR Ankle Complete Right   Meds ordered this encounter  Medications  . oxyCODONE-acetaminophen (PERCOCET) 5-325 MG tablet    Sig: Take 1-2 tablets by mouth 2 (two) times daily as needed for severe pain.    Dispense:  30 tablet    Refill:  0    Imaging: XR Ankle Complete Right  Result Date: 07/15/2019 X-rays demonstrate stable alignment of the fractures  without interval change   PMFS History: Patient Active Problem List   Diagnosis Date Noted  . Closed trimalleolar fracture of ankle, right, initial encounter 06/30/2019  . 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 H/O varicella   . Scoliosis   . 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 Uveitis    uses steroid eye drops    Family History  Problem Relation Age of Onset  . Sjogren's syndrome Mother   . Hypertension Mother   . Arthritis Mother   . Anemia Mother   . Diabetes Father   . Heart attack Maternal Grandfather   . Hypertension Maternal Grandfather     Past Surgical History:  Procedure Laterality Date  . LAPAROSCOPY    . ORIF ANKLE FRACTURE Right 07/01/2019   Procedure: OPEN REDUCTION INTERNAL FIXATION (ORIF) RIGHT TRIMALLEOLAR ANKLE FRACTURE;  Surgeon: Leandrew Koyanagi, MD;  Location: [facility];  Service: Orthopedics;  Laterality: Right;   Social History   Occupational History  . Not on file  Tobacco Use  . Smoking status: Never Smoker  . Smokeless tobacco: Never Used  Substance and Sexual Activity  . Alcohol use: No  . Drug use: No  . Sexual activity: Yes

## 2019-07-29 ENCOUNTER — Telehealth: Payer: Self-pay | Admitting: Orthopaedic Surgery

## 2019-07-29 NOTE — Telephone Encounter (Signed)
Patient called requesting a call back. Patient has question about medications. Patient asking for Dr. Erlinda Hong nurse to call patient at [phone].

## 2019-07-30 ENCOUNTER — Other Ambulatory Visit: Payer: Self-pay | Admitting: Physician Assistant

## 2019-07-30 MED ORDER — HYDROCODONE-ACETAMINOPHEN 5-325 MG PO TABS: 1.0000 | ORAL_TABLET | Freq: Four times a day (QID) | ORAL | 0 refills | Status: DC | PRN

## 2019-07-30 NOTE — Telephone Encounter (Signed)
Spoke to patient

## 2019-07-30 NOTE — Telephone Encounter (Signed)
Called patient and states she Would like a callback from you ASAP.   CB [phone]

## 2019-08-12 ENCOUNTER — Encounter: Payer: Self-pay | Admitting: Orthopaedic Surgery

## 2019-08-12 ENCOUNTER — Ambulatory Visit (INDEPENDENT_AMBULATORY_CARE_PROVIDER_SITE_OTHER): Payer: 59

## 2019-08-12 ENCOUNTER — Other Ambulatory Visit: Payer: Self-pay

## 2019-08-12 ENCOUNTER — Ambulatory Visit (INDEPENDENT_AMBULATORY_CARE_PROVIDER_SITE_OTHER): Payer: 59 | Admitting: Orthopaedic Surgery

## 2019-08-12 DIAGNOSIS — S82851A Displaced trimalleolar fracture of right lower leg, initial encounter for closed fracture: Secondary | ICD-10-CM | POA: Diagnosis not present

## 2019-08-12 MED ORDER — HYDROCODONE-ACETAMINOPHEN 5-325 MG PO TABS: 1.0000 | ORAL_TABLET | Freq: Two times a day (BID) | ORAL | 0 refills | Status: AC | PRN

## 2019-08-12 NOTE — Progress Notes (Signed)
   Post-Op Visit Note   Patient: Angela Glenn           Date of Birth: [birth year]           MRN: [id number] Visit Date: 08/12/2019 PCP: Patient, No Pcp Per   Assessment & Plan:  Chief Complaint:  Chief Complaint  Patient presents with  . Right Ankle - Pain, Follow-up   Visit Diagnoses:  1. Closed trimalleolar fracture of ankle, right, initial encounter     Plan: Patient is a pleasant 44 year old female who comes in today 6 weeks out ORIF right trimalleolar ankle fracture 07/01/2019.  She has been doing well.  She has been compliant nonweightbearing in her cam walker.  She has moderate pain which is really only at night.  She has been elevating for pain and swelling.  Examination of her right ankle reveals moderate swelling.  She does have fully healed surgical scars without complications.  Her fracture blisters have completely scabbed over.  Calf is soft and nontender.  She has very limited range of motion secondary to stiffness.  She does have moderate pain to the medial ankle.  No pain to the lateral ankle.  She is neurovascular intact distally.  Today, we will allow the patient to start weightbearing in her cam walker.  We will start her in formal physical therapy.  Internal prescription was sent in.  She will follow-up with [place] in 6 weeks time for repeat evaluation three-view x-rays of the right ankle.  Call with concerns or questions in the meantime.  Follow-Up Instructions: Return in about 6 weeks (around 09/23/2019).   Orders:  Orders Placed This Encounter  Procedures  . XR Ankle Complete Right  . Ambulatory referral to Physical Therapy   Meds ordered this encounter  Medications  . HYDROcodone-acetaminophen (NORCO) 5-325 MG tablet    Sig: Take 1-2 tablets by mouth 2 (two) times daily as needed for moderate pain.    Dispense:  30 tablet    Refill:  0    Imaging: XR Ankle Complete Right  Result Date: 08/12/2019 X-rays demonstrate stable alignment of the fractures with  evidence of bony consolidation   PMFS History: Patient Active Problem List   Diagnosis Date Noted  . Closed trimalleolar fracture of ankle, right, initial encounter 06/30/2019  . 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 H/O varicella   . Scoliosis   . 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 Uveitis    uses steroid eye drops    Family History  Problem Relation Age of Onset  . Sjogren's syndrome Mother   . Hypertension Mother   . Arthritis Mother   . Anemia Mother   . Diabetes Father   . Heart attack Maternal Grandfather   . Hypertension Maternal Grandfather     Past Surgical History:  Procedure Laterality Date  . LAPAROSCOPY    . ORIF ANKLE FRACTURE Right 07/01/2019   Procedure: OPEN REDUCTION INTERNAL FIXATION (ORIF) RIGHT TRIMALLEOLAR ANKLE FRACTURE;  Surgeon: Tarry Kos, MD;  Location: [facility];  Service: Orthopedics;  Laterality: Right;   Social History   Occupational History  . Not on file  Tobacco Use  . Smoking status: Never Smoker  . Smokeless tobacco: Never Used  Substance and Sexual Activity  . Alcohol use: No  . Drug use: No  . Sexual activity: Yes

## 2019-08-13 ENCOUNTER — Telehealth: Payer: Self-pay | Admitting: Physician Assistant

## 2019-08-13 NOTE — Telephone Encounter (Signed)
Patient called asked if a stronger medication for therapy was called into her pharmacy. Patient asked if she should continue taking baby aspirin 81 mg. Patient asked if she can sleep on her side or continue sleeping on her back. The number to contact patient is [phone]

## 2019-08-14 ENCOUNTER — Other Ambulatory Visit: Payer: Self-pay | Admitting: Physician Assistant

## 2019-08-14 NOTE — Telephone Encounter (Signed)
She was only taking norco at night so I wrote a new rx so she would have more and be able to take when going to PT.  Only needed to take asa until weight bearing.  We told her she can start weight bearing so once she is she can stop asa.  Can sleep however is comfortable

## 2019-08-17 ENCOUNTER — Telehealth: Payer: Self-pay | Admitting: Orthopaedic Surgery

## 2019-08-17 NOTE — Telephone Encounter (Signed)
We need to continue to wean at this point.  Norco should be sufficient for PT. Can supplement with ibuprofen if needed

## 2019-08-17 NOTE — Telephone Encounter (Signed)
Called patient to advise. She Would like Rx  stronger for PT

## 2019-08-17 NOTE — Telephone Encounter (Signed)
Called patient to advise on msg below. Noanswer.LMOM.

## 2019-08-17 NOTE — Telephone Encounter (Signed)
She said that you had mentioned you would give her something stronger for when she did PT.

## 2019-08-17 NOTE — Telephone Encounter (Signed)
Patient called.   She was returning the call she just missed.   Call back: [phone]

## 2019-08-18 ENCOUNTER — Other Ambulatory Visit: Payer: Self-pay | Admitting: Physician Assistant

## 2019-08-18 MED ORDER — OXYCODONE-ACETAMINOPHEN 5-325 MG PO TABS: 1.0000 | ORAL_TABLET | Freq: Every day | ORAL | 0 refills | Status: AC | PRN

## 2019-08-18 NOTE — Telephone Encounter (Signed)
Will write for one percocet rx.  After this is finished, we cannot write any narcotics.  Will have to wean to tramadol altogether.

## 2019-08-20 NOTE — Telephone Encounter (Signed)
Patient aware Rx called in for her and the below message

## 2019-08-28 ENCOUNTER — Telehealth: Payer: Self-pay | Admitting: Orthopaedic Surgery

## 2019-08-28 NOTE — Telephone Encounter (Signed)
Patient called asked if she can use any kind of moisturizer on her right leg. Patient advised she started (PT) 08/20/2019 and the skin is starting to peel. The number to contact patient is [phone]

## 2019-08-28 NOTE — Telephone Encounter (Signed)
Yes she can use any moisturizer she wants

## 2019-08-31 NOTE — Telephone Encounter (Signed)
Called to advise.  

## 2019-09-15 ENCOUNTER — Telehealth: Payer: Self-pay | Admitting: Radiology

## 2019-09-15 NOTE — Telephone Encounter (Signed)
Had surgery on Right ankle but having jumped out of bed quickly and put pants on, felt a crunch in the left leg, no real pain at the time, she has had discomfort in it since, this was 2 weeks ago, she states that now there is tightness in the left leg and around the ankle.  States that there is a static sensation that she is getting in her leg.  She is able to get around on it.  Please advise on what she should be doing.

## 2019-09-15 NOTE — Telephone Encounter (Signed)
Can we make appt for her? Thank you.

## 2019-09-15 NOTE — Telephone Encounter (Signed)
She can make appt to be evaluated.  Thanks.

## 2019-09-16 NOTE — Telephone Encounter (Signed)
Pt already has an appt for 2/24, called and offered to move sooner but pt cant come in sooner.

## 2019-09-23 ENCOUNTER — Other Ambulatory Visit: Payer: Self-pay

## 2019-09-23 ENCOUNTER — Ambulatory Visit: Payer: Self-pay

## 2019-09-23 ENCOUNTER — Encounter: Payer: Self-pay | Admitting: Orthopaedic Surgery

## 2019-09-23 ENCOUNTER — Ambulatory Visit (INDEPENDENT_AMBULATORY_CARE_PROVIDER_SITE_OTHER): Payer: 59 | Admitting: Orthopaedic Surgery

## 2019-09-23 DIAGNOSIS — S82851A Displaced trimalleolar fracture of right lower leg, initial encounter for closed fracture: Secondary | ICD-10-CM | POA: Diagnosis not present

## 2019-09-23 DIAGNOSIS — M25572 Pain in left ankle and joints of left foot: Secondary | ICD-10-CM

## 2019-09-23 MED ORDER — HYDROCODONE-ACETAMINOPHEN 5-325 MG PO TABS: 1.0000 | ORAL_TABLET | Freq: Every day | ORAL | 0 refills | Status: AC | PRN

## 2019-09-23 NOTE — Progress Notes (Signed)
Office Visit Note   Patient: Angela Glenn           Date of Birth: [birth year]           MRN: [id number] Visit Date: 09/23/2019              Requested by: No referring provider defined for this encounter. PCP: Patient, No Pcp Per   Assessment & Plan: Visit Diagnoses:  1. Closed trimalleolar fracture of ankle, right, initial encounter   2. Pain in left ankle and joints of left foot     Plan: [name] is 12 weeks status post ORIF right trimalleolar ankle fracture and new problem of left ankle pain.  In terms of the right ankle she will continue with physical therapy and progress as indicated.  She can be weight-bear as tolerated and she can wean out of the cam boot into an ASO brace based on evaluation by PT and her comfort level.  For the left ankle x-rays were negative and this is likely due to compensation from being nonweightbearing to the right ankle.  I would like to recheck her in 6 weeks with three-view x-rays of the right ankle.  Follow-Up Instructions: Return in about 6 weeks (around 11/04/2019).   Orders:  Orders Placed This Encounter  Procedures  . XR Ankle Complete Right  . XR Ankle Complete Left   Meds ordered this encounter  Medications  . HYDROcodone-acetaminophen (NORCO) 5-325 MG tablet    Sig: Take 1-2 tablets by mouth daily as needed.    Dispense:  20 tablet    Refill:  0      Procedures: No procedures performed   Clinical Data: No additional findings.   Subjective: Chief Complaint  Patient presents with  . Right Ankle - Pain    Angela Glenn is 12 weeks status post ORIF right trimalleolar ankle fracture.  She has made recent progress with physical therapy.  She has been weightbearing more and has been able to ambulate over 150 feet with a walker and a boot.  She is also complaining of some mild discomfort in the left ankle.   Review of Systems  Constitutional: Negative.   HENT: Negative.   Eyes: Negative.   Respiratory: Negative.   Cardiovascular:  Negative.   Endocrine: Negative.   Musculoskeletal: Negative.   Neurological: Negative.   Hematological: Negative.   Psychiatric/Behavioral: Negative.   All other systems reviewed and are negative.    Objective: Vital Signs: There were no vitals taken for this visit.  Physical Exam Vitals and nursing note reviewed.  Constitutional:      Appearance: She is well-developed.  Pulmonary:     Effort: Pulmonary effort is normal.  Skin:    General: Skin is warm.     Capillary Refill: Capillary refill takes less than 2 seconds.  Neurological:     Mental Status: She is alert and oriented to person, place, and time.  Psychiatric:        Behavior: Behavior normal.        Thought Content: Thought content normal.        Judgment: Judgment normal.     Ortho Exam Right ankle exam shows fully healed lateral scar.  The medial scar is healed with a stable scab.  No evidence of infection.  There is moderate swelling.  Moderate Achilles tightness.  Left ankle exam is benign. Specialty Comments:  No specialty comments available.  Imaging: XR Ankle Complete Left  Result Date: 09/23/2019 No acute or structural abnormalities  XR Ankle Complete Right  Result Date: 09/23/2019 Stable hardware fixation.  Fractures appear to be fully healed.  There is disuse osteopenia of the ankle joint and the hindfoot.    PMFS History: Patient Active Problem List   Diagnosis Date Noted  . Pain in left ankle and joints of left foot 09/23/2019  . Closed trimalleolar fracture of ankle, right, initial encounter 06/30/2019  . 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 H/O varicella   . Scoliosis   . 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 Uveitis    uses steroid eye drops    Family History  Problem Relation Age of Onset  . Sjogren's syndrome Mother   . Hypertension Mother   . Arthritis Mother   . Anemia Mother   . Diabetes Father   . Heart attack Maternal Grandfather   . Hypertension  Maternal Grandfather     Past Surgical History:  Procedure Laterality Date  . LAPAROSCOPY    . ORIF ANKLE FRACTURE Right 07/01/2019   Procedure: OPEN REDUCTION INTERNAL FIXATION (ORIF) RIGHT TRIMALLEOLAR ANKLE FRACTURE;  Surgeon: Leandrew Koyanagi, MD;  Location: [facility];  Service: Orthopedics;  Laterality: Right;   Social History   Occupational History  . Not on file  Tobacco Use  . Smoking status: Never Smoker  . Smokeless tobacco: Never Used  Substance and Sexual Activity  . Alcohol use: No  . Drug use: No  . Sexual activity: Yes

## 2019-10-01 ENCOUNTER — Other Ambulatory Visit: Payer: Self-pay | Admitting: Physician Assistant

## 2019-10-01 ENCOUNTER — Telehealth: Payer: Self-pay | Admitting: Orthopaedic Surgery

## 2019-10-01 MED ORDER — METHOCARBAMOL 500 MG PO TABS: 500.0000 mg | ORAL_TABLET | Freq: Two times a day (BID) | ORAL | 2 refills | Status: AC | PRN

## 2019-10-01 NOTE — Telephone Encounter (Signed)
Patient called for a call back from [place].requesting methocarbamol. Patient states another medication was prescribed but methocabamol is needed. Please send to pharmacy on file. Patient phone number is [phone]

## 2019-10-01 NOTE — Telephone Encounter (Signed)
Entin

## 2019-10-02 NOTE — Telephone Encounter (Signed)
Called patient to let her know Rx was called into her pharm. No answer LMOM.

## 2019-11-04 ENCOUNTER — Ambulatory Visit: Payer: 59 | Admitting: Orthopaedic Surgery

## 2019-11-04 ENCOUNTER — Telehealth: Payer: Self-pay

## 2019-11-04 ENCOUNTER — Telehealth: Payer: Self-pay | Admitting: Orthopaedic Surgery

## 2019-11-04 ENCOUNTER — Ambulatory Visit: Payer: Self-pay

## 2019-11-04 ENCOUNTER — Other Ambulatory Visit: Payer: Self-pay

## 2019-11-04 DIAGNOSIS — S82851A Displaced trimalleolar fracture of right lower leg, initial encounter for closed fracture: Secondary | ICD-10-CM

## 2019-11-04 NOTE — Progress Notes (Signed)
Office Visit Note   Patient: Angela Glenn           Date of Birth: [birth year]           MRN: [id number] Visit Date: 11/04/2019              Requested by: No referring provider defined for this encounter. PCP: Patient, No Pcp Per   Assessment & Plan: Visit Diagnoses:  1. Closed trimalleolar fracture of ankle, right, initial encounter     Plan: Impression is 4 months status post ORIF right trimalleolar ankle fracture.  X-rays and demonstrated complete fracture healing.  She will continue with physical therapy as I feel that she has a lot of work to do in terms of strength and stability and stamina therefore she will need to maximize her physical therapy allotment per insurance.  We will place an urgent approval for additional PT sessions today.  From my standpoint we can see her back as needed.  Follow-Up Instructions: Return if symptoms worsen or fail to improve.   Orders:  Orders Placed This Encounter  Procedures  . XR Ankle Complete Right   No orders of the defined types were placed in this encounter.     Procedures: No procedures performed   Clinical Data: No additional findings.   Subjective: Chief Complaint  Patient presents with  . Right Ankle - Pain, Follow-up    Angela Glenn is 4 months status post ORIF right trimalleolar ankle fracture.  She comes in for follow-up.  She has 2 more sessions of physical therapy remaining.  She is ambulating with a cane at this point.  She still has some issues with swelling and stiffness in her ankle.  She has limited stamina as well.   Review of Systems   Objective: Vital Signs: There were no vitals taken for this visit.  Physical Exam  Ortho Exam Right ankle shows fully healed surgical scars.  Ankle dorsiflexion is to neutral.  No tenderness to palpation.  Mild swelling and pitting edema. Specialty Comments:  No specialty comments available.  Imaging: XR Ankle Complete Right  Result Date: 11/04/2019 Fully healed  trimalleolar ankle fracture without hardware complication.    PMFS History: Patient Active Problem List   Diagnosis Date Noted  . Pain in left ankle and joints of left foot 09/23/2019  . Closed trimalleolar fracture of ankle, right, initial encounter 06/30/2019  . 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 H/O varicella   . Scoliosis   . 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 Uveitis    uses steroid eye drops    Family History  Problem Relation Age of Onset  . Sjogren's syndrome Mother   . Hypertension Mother   . Arthritis Mother   . Anemia Mother   . Diabetes Father   . Heart attack Maternal Grandfather   . Hypertension Maternal Grandfather     Past Surgical History:  Procedure Laterality Date  . LAPAROSCOPY    . ORIF ANKLE FRACTURE Right 07/01/2019   Procedure: OPEN REDUCTION INTERNAL FIXATION (ORIF) RIGHT TRIMALLEOLAR ANKLE FRACTURE;  Surgeon: Tarry Kos, MD;  Location: [facility];  Service: Orthopedics;  Laterality: Right;   Social History   Occupational History  . Not on file  Tobacco Use  . Smoking status: Never Smoker  . Smokeless tobacco: Never Used  Substance and Sexual Activity  . Alcohol use: No  . Drug use: No  . Sexual activity: Yes

## 2019-11-04 NOTE — Telephone Encounter (Signed)
Patient called. She would like to know if she is able to drive or not. Her call back number is [phone]

## 2019-11-04 NOTE — Telephone Encounter (Signed)
Yes for sure

## 2019-11-04 NOTE — Telephone Encounter (Signed)
PT order has been faxed to [facility] [phone]. They need to approve more PT visits.   Will call tomorrow to let them know.

## 2019-11-05 NOTE — Telephone Encounter (Signed)
Called patient to advise.  She also would like to know if Can she get full body massage? Will it mess up anything in her ankle she asked?

## 2019-11-05 NOTE — Telephone Encounter (Signed)
Yes that's fine 

## 2019-11-05 NOTE — Telephone Encounter (Signed)
[facility] GOT ORDER

## 2019-11-05 NOTE — Telephone Encounter (Signed)
Patient aware.

## 2019-12-18 ENCOUNTER — Telehealth: Payer: Self-pay | Admitting: Orthopaedic Surgery

## 2019-12-18 NOTE — Telephone Encounter (Signed)
CD made 

## 2019-12-18 NOTE — Telephone Encounter (Signed)
I am needing xrays on CD please. Patient has signed a release. Please advise when ready and I will pick up. Thanks!

## 2020-06-14 ENCOUNTER — Ambulatory Visit: Payer: Self-pay

## 2020-06-14 ENCOUNTER — Encounter: Payer: Self-pay | Admitting: Orthopaedic Surgery

## 2020-06-14 ENCOUNTER — Ambulatory Visit: Payer: 59 | Admitting: Orthopaedic Surgery

## 2020-06-14 DIAGNOSIS — S82851A Displaced trimalleolar fracture of right lower leg, initial encounter for closed fracture: Secondary | ICD-10-CM

## 2020-06-14 MED ORDER — AMITRIPTYLINE HCL 25 MG PO TABS: 25.0000 mg | ORAL_TABLET | Freq: Every day | ORAL | 2 refills | Status: AC

## 2020-06-14 NOTE — Progress Notes (Signed)
Office Visit Note   Patient: Angela Glenn           Date of Birth: 06-Dec-1975           MRN: [id number] Visit Date: 06/14/2020              Requested by: No referring provider defined for this encounter. PCP: Patient, No Pcp Per   Assessment & Plan: Visit Diagnoses:  1. Closed trimalleolar fracture of ankle, right, initial encounter     Plan: Impression is continued sensitivity from her right ankle injury.  Her fracture has completely healed at this point.  I do not think she needs more physical therapy as she has regained full strength and range of motion.  We will go ahead and start her on Elavil to take at night hopefully help with the sensitivity and paresthesias.  We will also obtain venous Doppler ultrasound right lower extremity to rule out DVT.  She will follow up with [place] as needed.  Total face to face encounter time was greater than 25 minutes and over half of this time was spent in counseling and/or coordination of care.   Follow-Up Instructions: Return if symptoms worsen or fail to improve.   Orders:  Orders Placed This Encounter  Procedures  . XR Ankle Complete Right   Meds ordered this encounter  Medications  . amitriptyline (ELAVIL) 25 MG tablet    Sig: Take 1 tablet (25 mg total) by mouth at bedtime.    Dispense:  30 tablet    Refill:  2      Procedures: No procedures performed   Clinical Data: No additional findings.   Subjective: Chief Complaint  Patient presents with  . Right Ankle - Pain    HPI patient is a 44 year old female who comes in today with continued right ankle pain and paresthesias.  She is status post ORIF right trimalleolar ankle fracture 07/01/2019.  She notes that she has had continued pain to the medial ankle, right lower leg as well as sensitivity to the dorsum and medial foot since surgery.  She has completed physical therapy as her "time was all "she was able to wean herself off of a cane several months back.  She has  associated swelling of the right ankle which appears to be worse during the cold weather.  She wears compression socks which do seem to help.  No personal or family history of DVT/PE or clotting disorders.  She is a non-smoker.  She does not take any oral contraceptives.  Review of Systems as detailed in HPI.  All others reviewed and are negative.   Objective: Vital Signs: There were no vitals taken for this visit.  Physical Exam well-developed and well-nourished female in no acute distress.  Alert and oriented x3.  Ortho Exam right ankle shows mild swelling.  She does have moderate tenderness to the medial ankle.  No tenderness the posterior tibial tendon.  No lateral ankle tenderness.  She has full range of motion and strength throughout.  She is neurovascular intact distally.  She does have moderate tenderness to the distal calf.  No erythema.  Specialty Comments:  No specialty comments available.  Imaging: XR Ankle Complete Right  Result Date: 06/14/2020 Completely healed fractures.  No hardware complications.    PMFS History: Patient Active Problem List   Diagnosis Date Noted  . Pain in left ankle and joints of left foot 09/23/2019  . Closed trimalleolar fracture of ankle, right, initial encounter 06/30/2019  .  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 H/O varicella   . Scoliosis   . 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 Uveitis    uses steroid eye drops    Family History  Problem Relation Age of Onset  . Sjogren's syndrome Mother   . Hypertension Mother   . Arthritis Mother   . Anemia Mother   . Diabetes Father   . Heart attack Maternal Grandfather   . Hypertension Maternal Grandfather     Past Surgical History:  Procedure Laterality Date  . LAPAROSCOPY    . ORIF ANKLE FRACTURE Right 07/01/2019   Procedure: OPEN REDUCTION INTERNAL FIXATION (ORIF) RIGHT TRIMALLEOLAR ANKLE FRACTURE;  Surgeon: Tarry Kos, MD;  Location: [facility];   Service: Orthopedics;  Laterality: Right;   Social History   Occupational History  . Not on file  Tobacco Use  . Smoking status: Never Smoker  . Smokeless tobacco: Never Used  Vaping Use  . Vaping Use: Never used  Substance and Sexual Activity  . Alcohol use: No  . Drug use: No  . Sexual activity: Yes

## 2020-06-15 ENCOUNTER — Ambulatory Visit (HOSPITAL_COMMUNITY)
Admission: RE | Admit: 2020-06-15 | Discharge: 2020-06-15 | Disposition: A | Discharge status: Home / Self Care | Payer: 59 | Source: Ambulatory Visit | Attending: Orthopaedic Surgery | Admitting: Orthopaedic Surgery

## 2020-06-15 ENCOUNTER — Other Ambulatory Visit: Payer: Self-pay

## 2020-06-15 ENCOUNTER — Encounter: Payer: Self-pay | Admitting: *Deleted

## 2020-06-15 DIAGNOSIS — S82851A Displaced trimalleolar fracture of right lower leg, initial encounter for closed fracture: Secondary | ICD-10-CM | POA: Diagnosis not present

## 2020-06-15 NOTE — Progress Notes (Signed)
Lower extremity venous RT study completed.  Preliminary results relayed to provider.   See CV Proc for preliminary results report.   Jean Rosenthal, RDMS

## 2020-07-01 ENCOUNTER — Ambulatory Visit: Payer: 59 | Admitting: Orthopaedic Surgery

## 2020-07-06 ENCOUNTER — Other Ambulatory Visit: Payer: Self-pay | Admitting: Physician Assistant

## 2020-07-19 ENCOUNTER — Telehealth: Payer: Self-pay | Admitting: Orthopaedic Surgery

## 2020-07-19 NOTE — Telephone Encounter (Signed)
Called patient and advised that copy and CD of x-rays was ready for pickup at front desk.

## 2020-07-19 NOTE — Telephone Encounter (Signed)
Please see msg below for xry CD request. Thank you!

## 2020-07-19 NOTE — Telephone Encounter (Signed)
Received medical records release form from patient  Needing copy of X-ray 06/14/2020   Printed copy and on CD

## 2021-07-20 IMAGING — RF DG C-ARM 1-60 MIN
1 series · 3 of 3 positions shown · non-contrast
Comparison: None.

CLINICAL DATA: ORIF trimalleolar fracture

EXAM:
DG C-ARM 1-60 MIN; RIGHT ANKLE - COMPLETE 3+ VIEW
FLUOROSCOPY TIME:  Fluoroscopy Time:  55 seconds

[Series 1: run · 3 of 3 slices shown]
[im 1/3]
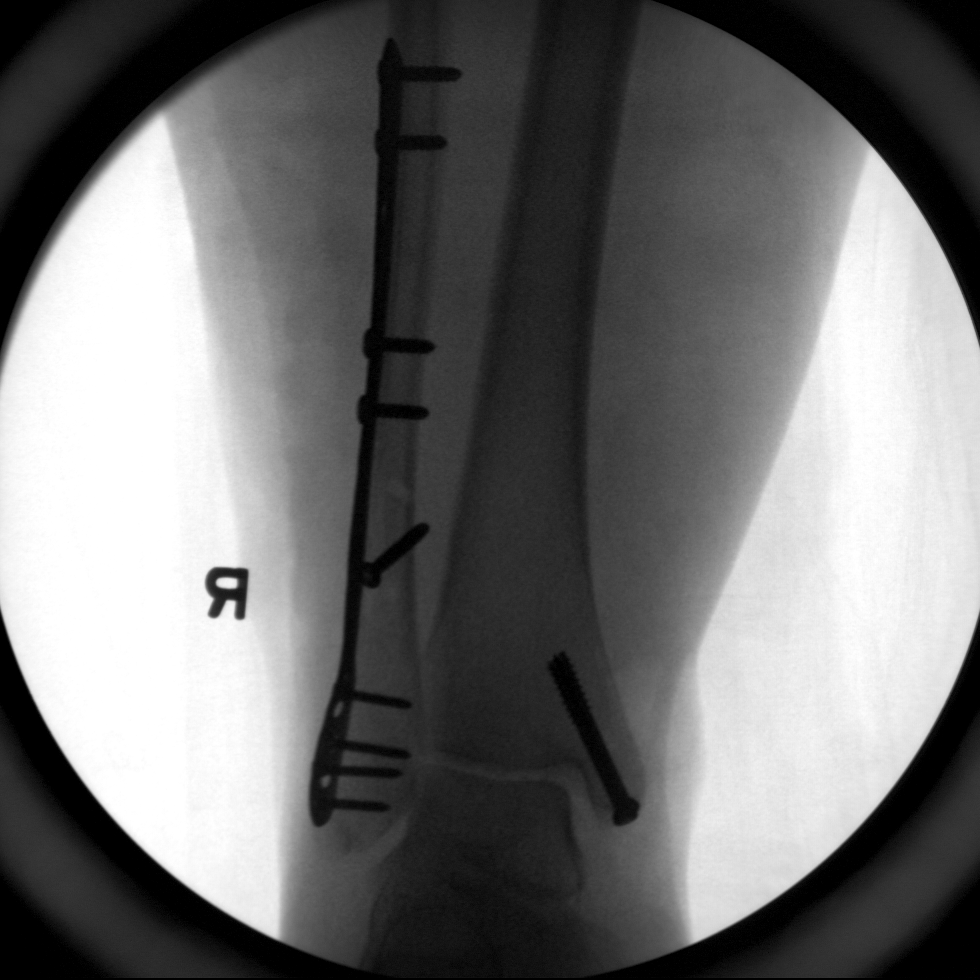
[im 2/3]
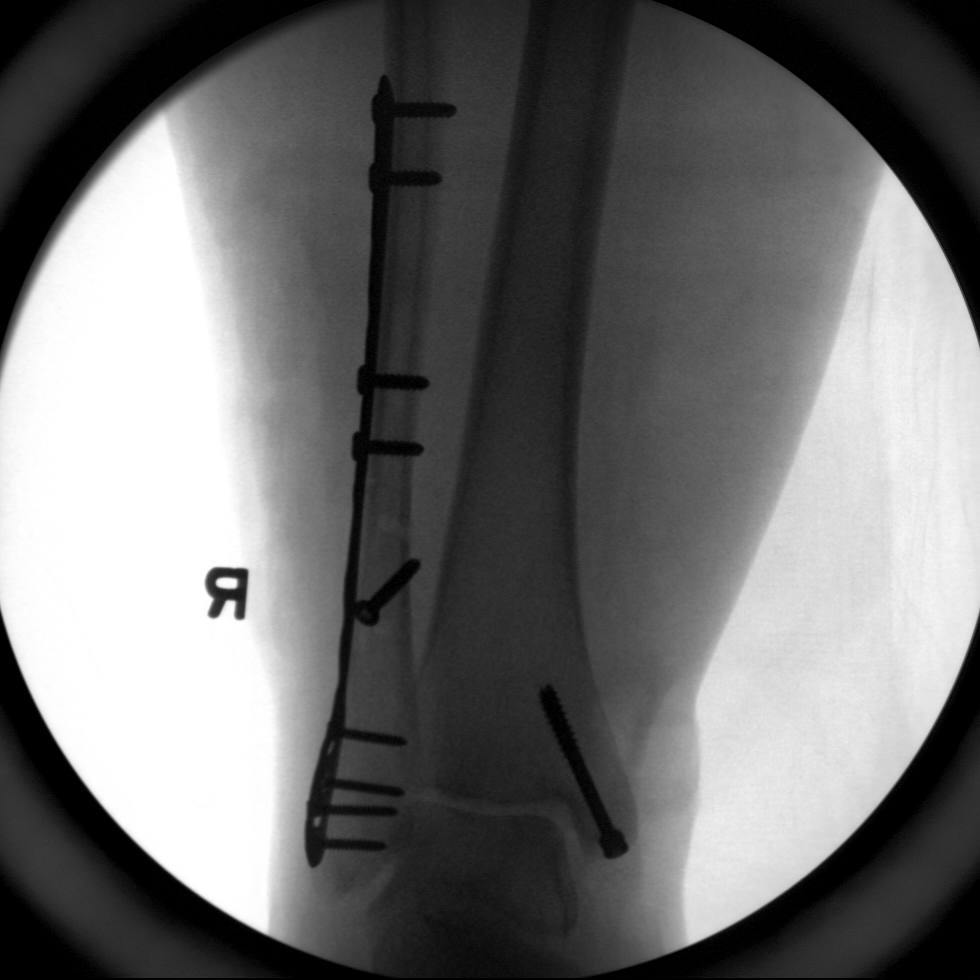
[im 3/3]
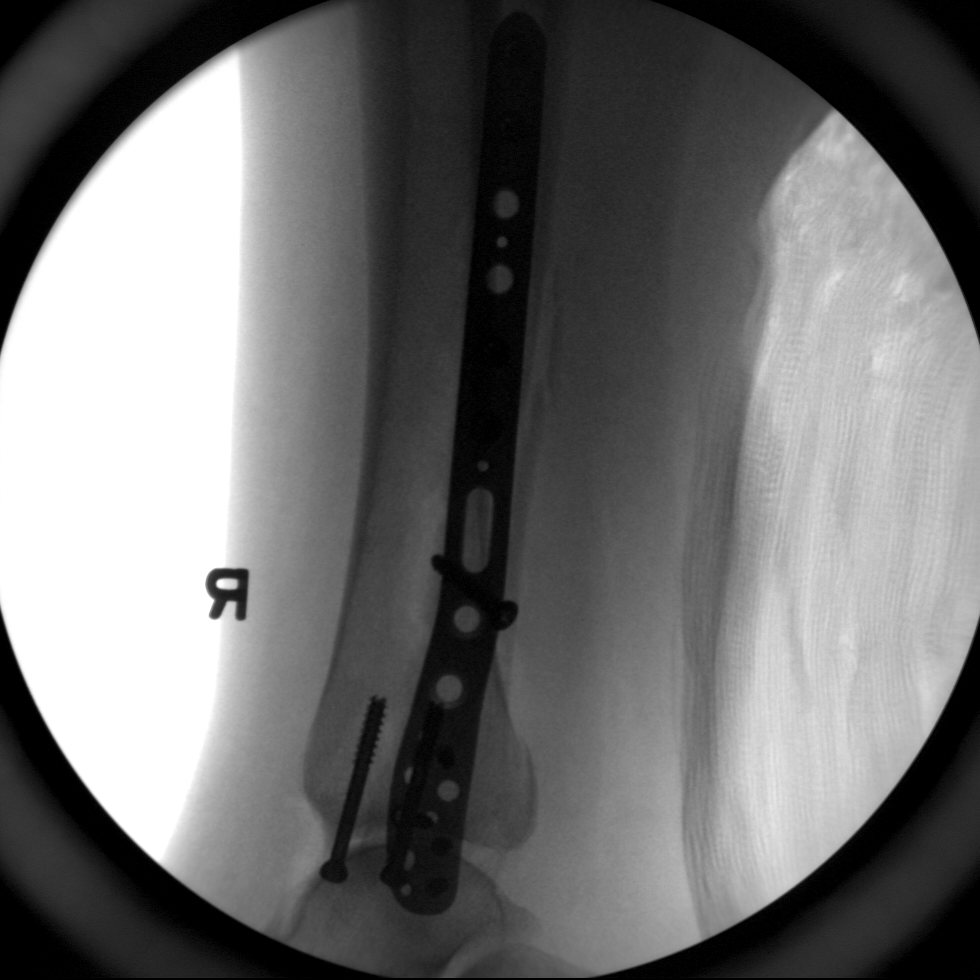

[3 of 3 positions shown; findings below may reference images not displayed]

FINDINGS: Distal fibular fracture transfixed with a lateral sideplate and
multiple interlocking screws. Medial malleolar fracture transfixed
with 2 cannulated screws.
IMPRESSION: Interval ORIF of the medial and lateral malleolar fractures.
Redemonstrated posterior distal tibial malleolar fracture.

## 2023-04-25 ENCOUNTER — Emergency Department (HOSPITAL_BASED_OUTPATIENT_CLINIC_OR_DEPARTMENT_OTHER)
Admission: EM | Admit: 2023-04-25 | Discharge: 2023-04-25 | Disposition: A | Discharge status: Home / Self Care | Payer: 59 | Attending: Emergency Medicine | Admitting: Emergency Medicine

## 2023-04-25 ENCOUNTER — Encounter (HOSPITAL_BASED_OUTPATIENT_CLINIC_OR_DEPARTMENT_OTHER): Payer: Self-pay | Admitting: Emergency Medicine

## 2023-04-25 ENCOUNTER — Emergency Department (HOSPITAL_BASED_OUTPATIENT_CLINIC_OR_DEPARTMENT_OTHER): Payer: 59 | Admitting: Radiology

## 2023-04-25 DIAGNOSIS — Z7982 Long term (current) use of aspirin: Secondary | ICD-10-CM | POA: Insufficient documentation

## 2023-04-25 DIAGNOSIS — Z20822 Contact with and (suspected) exposure to covid-19: Secondary | ICD-10-CM | POA: Insufficient documentation

## 2023-04-25 DIAGNOSIS — J181 Lobar pneumonia, unspecified organism: Secondary | ICD-10-CM | POA: Insufficient documentation

## 2023-04-25 DIAGNOSIS — R059 Cough, unspecified: Secondary | ICD-10-CM | POA: Diagnosis present

## 2023-04-25 DIAGNOSIS — J40 Bronchitis, not specified as acute or chronic: Secondary | ICD-10-CM | POA: Insufficient documentation

## 2023-04-25 DIAGNOSIS — R531 Weakness: Secondary | ICD-10-CM | POA: Diagnosis not present

## 2023-04-25 DIAGNOSIS — J189 Pneumonia, unspecified organism: Secondary | ICD-10-CM

## 2023-04-25 LAB — CBC
HCT: 45.6 % (ref 36.0–46.0)
Hemoglobin: 14.6 g/dL (ref 12.0–15.0)
MCH: 25.8 pg — ABNORMAL LOW (ref 26.0–34.0)
MCHC: 32 g/dL (ref 30.0–36.0)
MCV: 80.6 fL (ref 80.0–100.0)
Platelets: 289 10*3/uL (ref 150–400)
RBC: 5.66 MIL/uL — ABNORMAL HIGH (ref 3.87–5.11)
RDW: 15.1 % (ref 11.5–15.5)
WBC: 15.2 10*3/uL — ABNORMAL HIGH (ref 4.0–10.5)
nRBC: 0 % (ref 0.0–0.2)

## 2023-04-25 LAB — BASIC METABOLIC PANEL
Anion gap: 8 (ref 5–15)
BUN: 19 mg/dL (ref 6–20)
CO2: 26 mmol/L (ref 22–32)
Calcium: 9.5 mg/dL (ref 8.9–10.3)
Chloride: 101 mmol/L (ref 98–111)
Creatinine, Ser: 0.79 mg/dL (ref 0.44–1.00)
GFR, Estimated: >60 mL/min (ref >60)
Glucose, Bld: 95 mg/dL (ref 70–99)
Potassium: 4.2 mmol/L (ref 3.5–5.1)
Sodium: 135 mmol/L (ref 135–145)

## 2023-04-25 LAB — TROPONIN I (HIGH SENSITIVITY): Troponin I (High Sensitivity): 3 ng/L (ref <18)

## 2023-04-25 LAB — RESP PANEL BY RT-PCR (RSV, FLU A&B, COVID)  RVPGX2
Influenza A by PCR: NEGATIVE
Influenza B by PCR: NEGATIVE
Resp Syncytial Virus by PCR: NEGATIVE
SARS Coronavirus 2 by RT PCR: NEGATIVE

## 2023-04-25 LAB — GROUP A STREP BY PCR: Group A Strep by PCR: NOT DETECTED

## 2023-04-25 MED ORDER — DOXYCYCLINE HYCLATE 100 MG PO CAPS
100.0000 mg | ORAL_CAPSULE | Freq: Two times a day (BID) | ORAL | 0 refills | Status: DC
Start: 2023-04-25 — End: 2023-04-25

## 2023-04-25 MED ORDER — DOXYCYCLINE HYCLATE 100 MG PO CAPS
100.0000 mg | ORAL_CAPSULE | Freq: Two times a day (BID) | ORAL | 0 refills | Status: AC
Start: 2023-04-25 — End: ?

## 2023-04-25 MED ORDER — BENZONATATE 100 MG PO CAPS
100.0000 mg | ORAL_CAPSULE | Freq: Three times a day (TID) | ORAL | 0 refills | Status: AC
Start: 2023-04-25 — End: ?

## 2023-04-25 MED ORDER — BENZONATATE 100 MG PO CAPS: 100.0000 mg | ORAL_CAPSULE | Freq: Three times a day (TID) | ORAL | 0 refills | Status: DC

## 2023-04-25 MED ORDER — BENZONATATE 100 MG PO CAPS
100.0000 mg | ORAL_CAPSULE | Freq: Once | ORAL | Status: AC
  Administered 2023-04-25: 100 mg via ORAL
  Filled 2023-04-25: qty 1

## 2023-04-25 MED ORDER — IPRATROPIUM-ALBUTEROL 0.5-2.5 (3) MG/3ML IN SOLN
3.0000 mL | Freq: Once | RESPIRATORY_TRACT | Status: AC
  Administered 2023-04-25: 3 mL via RESPIRATORY_TRACT
  Filled 2023-04-25: qty 3

## 2023-04-25 MED ORDER — DOXYCYCLINE HYCLATE 100 MG PO TABS
100.0000 mg | ORAL_TABLET | Freq: Once | ORAL | Status: AC
  Administered 2023-04-25: 100 mg via ORAL
  Filled 2023-04-25: qty 1

## 2023-04-25 NOTE — ED Notes (Signed)
Reviewed discharge instructions, medications, and home care with pt. Pt verbalized understanding and had no further questions. Pt exited ED without complications.

## 2023-04-25 NOTE — ED Provider Notes (Addendum)
[place] EMERGENCY DEPARTMENT AT [facility] Provider Note   CSN: [id number] Arrival date & time: 04/25/23  1753     History  Chief Complaint  Patient presents with   Cough    Angela Glenn is a 47 y.o. female with no pertinent past medical history presented with 1 month of productive cough and generalized weakness.  Patient went to an urgent care and tested negative for flu COVID and at that time was given azithromycin for suspected pneumonia and took the medicine the way through but states that she is still having symptoms.  Patient notes that she is coughing up clear sputum and that her symptoms have remained the same and persistent and now that her throat is sore and chest hurts from all the coughing.  Patient tried Mucinex no relief.  Patient does note sick contacts at work.  Patient recently came back in [place] but states that the symptoms have been going on before then denies any leg swelling, hemoptysis, personal history cancer.  Patient also notes that the urgent care said that she had fluid in your ears as well but feels better.  Patient states she has been able to eat and drink without issue.  Patient denies chest pain, change sensation of motor skills, focal weakness, nausea/vomiting  Home Medications Prior to Admission medications   Medication Sig Start Date End Date Taking? Authorizing Provider  benzonatate (TESSALON) 100 MG capsule Take 1 capsule (100 mg total) by mouth every 8 (eight) hours. 04/25/23  Yes Jenel Gierke, Beverly Gust, PA-C  doxycycline (VIBRAMYCIN) 100 MG capsule Take 1 capsule (100 mg total) by mouth 2 (two) times daily. 04/25/23  Yes Elliyah Liszewski, Beverly Gust, PA-C  amitriptyline (ELAVIL) 25 MG tablet Take 1 tablet (25 mg total) by mouth at bedtime. 06/14/20   Cristie Hem, PA-C  aspirin EC 81 MG tablet Take 81 mg by mouth daily.    [provider]  calcium-vitamin D (OSCAL WITH D) 500-200 MG-UNIT tablet Take 1 tablet by mouth 3 (three) times daily.  07/01/19   Tarry Kos, MD  HYDROcodone-acetaminophen (NORCO) 5-325 MG tablet Take 1-2 tablets by mouth 2 (two) times daily as needed for moderate pain. 08/12/19   Cristie Hem, PA-C  HYDROcodone-acetaminophen (NORCO) 5-325 MG tablet Take 1-2 tablets by mouth daily as needed. 09/23/19   Tarry Kos, MD  ibuprofen (ADVIL) 800 MG tablet Take 800 mg by mouth every 8 (eight) hours as needed.    [provider]  ketorolac (TORADOL) 10 MG tablet Take 1 tablet (10 mg total) by mouth 2 (two) times daily as needed. 07/01/19   Tarry Kos, MD  methocarbamol (ROBAXIN) 500 MG tablet Take 1 tablet (500 mg total) by mouth 2 (two) times daily as needed for muscle spasms. 10/01/19   Cristie Hem, PA-C  oxyCODONE (OXY IR/ROXICODONE) 5 MG immediate release tablet Take 1-2 tablets (5-10 mg total) by mouth every 8 (eight) hours as needed for severe pain. 07/01/19   Tarry Kos, MD  oxyCODONE-acetaminophen (PERCOCET) 5-325 MG tablet Take 1-2 tablets by mouth daily as needed for severe pain. 08/18/19   Cristie Hem, PA-C  prednisoLONE acetate (PRED FORTE) 1 % ophthalmic suspension 1 drop 4 (four) times daily.    [provider]  promethazine (PHENERGAN) 25 MG tablet Take 1 tablet (25 mg total) by mouth every 6 (six) hours as needed for nausea. 07/01/19   Tarry Kos, MD  senna-docusate (SENOKOT S) 8.6-50 MG tablet Take 1-2 tablets  by mouth at bedtime as needed. 07/01/19   Tarry Kos, MD  zinc sulfate 220 (50 Zn) MG capsule Take 1 capsule (220 mg total) by mouth daily. 07/01/19   Tarry Kos, MD      Allergies    Shellfish allergy and Amoxicillin    Review of Systems   Review of Systems  Respiratory:  Positive for cough.     Physical Exam Updated Vital Signs BP 120/81   Pulse 90   Temp 98.4 F (36.9 C)   Resp 20   LMP 01/28/2023   SpO2 100%  Physical Exam Vitals reviewed.  Constitutional:      General: She is not in acute distress.    Appearance: She is ill-appearing.   HENT:     Head: Normocephalic and atraumatic.     Right Ear: Tympanic membrane, ear canal and external ear normal.     Left Ear: Tympanic membrane, ear canal and external ear normal.     Nose: Congestion present.     Mouth/Throat:     Mouth: Mucous membranes are moist.     Pharynx: No oropharyngeal exudate or posterior oropharyngeal erythema.  Eyes:     Extraocular Movements: Extraocular movements intact.     Conjunctiva/sclera: Conjunctivae normal.     Pupils: Pupils are equal, round, and reactive to light.  Cardiovascular:     Rate and Rhythm: Normal rate and regular rhythm.     Pulses: Normal pulses.     Heart sounds: Normal heart sounds.     Comments: 2+ bilateral radial/dorsalis pedis pulses with regular rate Pulmonary:     Effort: Pulmonary effort is normal. No respiratory distress.     Breath sounds: Rhonchi (Left side) present.  Abdominal:     Palpations: Abdomen is soft.     Tenderness: There is no abdominal tenderness. There is no guarding or rebound.  Musculoskeletal:        General: Normal range of motion.     Cervical back: Normal range of motion and neck supple. No rigidity or tenderness.     Comments: 5 out of 5 bilateral grip/leg extension strength  Lymphadenopathy:     Cervical: No cervical adenopathy.  Skin:    General: Skin is warm and dry.     Capillary Refill: Capillary refill takes less than 2 seconds.  Neurological:     General: No focal deficit present.     Mental Status: She is alert and oriented to person, place, and time.     Comments: Sensation intact in all 4 limbs  Psychiatric:        Mood and Affect: Mood normal.     ED Results / Procedures / Treatments   Labs (all labs ordered are listed, but only abnormal results are displayed) Labs Reviewed  CBC - Abnormal; Notable for the following components:      Result Value   WBC 15.2 (*)    RBC 5.66 (*)    MCH 25.8 (*)    All other components within normal limits  RESP PANEL BY RT-PCR (RSV,  FLU A&B, COVID)  [id number]  GROUP A STREP BY PCR  BASIC METABOLIC PANEL  TROPONIN I (HIGH SENSITIVITY)    EKG None  Radiology DG Chest 2 View  Result Date: 04/25/2023 CLINICAL DATA:  Shortness of breath, cough, congestion EXAM: CHEST - 2 VIEW COMPARISON:  None Available. FINDINGS: Perihilar and peribronchial thickening. Hazy opacities in the left lower lung may be due to atelectasis or pneumonia. No pneumothorax  or pleural effusion. Normal cardiomediastinal silhouette. No acute bone abnormality. IMPRESSION: Bronchitis/reactive airways. Atelectasis or pneumonia in the left lower lung. Electronically Signed   By: Minerva Fester M.D.   On: 04/25/2023 20:45    Procedures Procedures    Medications Ordered in ED Medications  benzonatate (TESSALON) capsule 100 mg (100 mg Oral Given 04/25/23 2059)  ipratropium-albuterol (DUONEB) 0.5-2.5 (3) MG/3ML nebulizer solution 3 mL (3 mLs Nebulization Given 04/25/23 2059)  doxycycline (VIBRA-TABS) tablet 100 mg (100 mg Oral Given 04/25/23 2059)    ED Course/ Medical Decision Making/ A&P                                 Medical Decision Making Amount and/or Complexity of Data Reviewed Labs: ordered. Radiology: ordered.  Risk Prescription drug management.   Angela Glenn 47 y.o. presented today for URI like symptoms. Working DDx that I considered at this time includes, but not limited to, viral illness, pneumonia, pharyngitis, mono, sinusitis, electrolyte abnormality, AOM.  R/o DDx: pharyngitis, mono, sinusitis, electrolyte abnormality, AOM: these diagnoses are not consistent with patient's history, presentation, physical exam, labs/imaging findings.  Review of prior external notes: 04/08/2023 office visit  Unique Tests and My Interpretation:  Respiratory Panel: Negative Group A Strep: Negative CBC: Leukocytosis 15.2, appears to be chronic BMP: Unremarkable Troponin: 3 Chest x-ray: Bronchitis with left sided pneumonia EKG: Sinus 93 bpm, no ST  elevations or depressions noted, no signs of right heart strain   Discussion with Independent Historian:  Husband  Discussion of Management of Tests: None  Risk: Medium: prescription drug management  Risk Stratification Score: None  Plan: On exam patient was in no acute distress but did appear ill with congestion and rhonchi noted to the left side however had stable vitals.  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  Patient was endorsing sore throat while in the room and so we will obtain a group A strep given duration of symptoms as she is already taking a Z-Pak for pneumonia and that did not help.  Chest x-ray was ordered as well.  Troponin ordered and negative.  Given duration of patient's symptoms only 1 troponin is required at this time.  Chest x-ray shows bronchitis with left sided pneumonia and so patient will be given 1 dose of doxycycline and Tessalon here for discharge.  At time of discharge patient requested a breathing treatment to help open up her lungs and so DuoNeb was ordered anticipate discharge afterwards.  Encourage patient to remain hydrated and eat plenty of food along with being on the rest of the antibiotics and Tessalon to help with her cough and to follow-up with her primary care provider.  Work note provided.  Patient states that she has not lactating or breast-feeding at this time and so doxycycline is safe.  Patient was given return precautions.patient stable for discharge at this time.  Patient verbalized understanding of plan.         Final Clinical Impression(s) / ED Diagnoses Final diagnoses:  Bronchitis  Pneumonia of left lung due to infectious organism, unspecified part of lung    Rx / DC Orders ED Discharge Orders          Ordered    benzonatate (TESSALON) 100 MG capsule  Every 8 hours        04/25/23 2054    doxycycline (VIBRAMYCIN) 100 MG capsule  2 times  daily        04/25/23 2054              Remi Deter 04/25/23 2114    Netta Corrigan, PA-C 04/25/23 2126    Terrilee Files, MD 04/26/23 [phone]

## 2023-04-25 NOTE — Discharge Instructions (Signed)
Please follow-up with your primary care provider regards to recent symptoms and ER visit.  Today your labs are reassuring however your x-ray shows a have bronchitis along with pneumonia.  You are given 1 dose of antibiotics here will need pick up the rest a pharmacy.  I have also prescribed Tessalon to help with your cough.  Please use honey and other congestions like Mucinex to help with your cough.  If symptoms change or worsen please return to the ER.

## 2023-04-25 NOTE — ED Notes (Signed)
ED Provider at bedside. 

## 2023-04-25 NOTE — ED Triage Notes (Signed)
Cough congestions body aches started beginning September.  Seen at [facility] "fluid on ears" given treatment  felt better. Airplane travel on Saturday, worsening cough, chills. Flight home today. Worse.

## 2023-05-17 ENCOUNTER — Ambulatory Visit (HOSPITAL_BASED_OUTPATIENT_CLINIC_OR_DEPARTMENT_OTHER): Payer: 59 | Admitting: Family Medicine

## 2023-06-20 ENCOUNTER — Encounter (HOSPITAL_BASED_OUTPATIENT_CLINIC_OR_DEPARTMENT_OTHER): Payer: Self-pay | Admitting: Family Medicine

## 2023-12-06 ENCOUNTER — Telehealth: Payer: Self-pay | Admitting: Gastroenterology

## 2023-12-06 NOTE — Telephone Encounter (Signed)
 Good afternoon Dr. Venice Gillis,   I received a call from this patient wishing to establish GI care with you and also schedule a colonoscopy. Patient was last seen with [facility] in 08/2023 to discuss having an EGD and Colon done but patient states she is not happy with the care received at that practice. Patient would like to schedule with you. Records from visit are in Care Everywhere for you to review. Would you please advise on scheduling?  Thank you.

## 2023-12-10 NOTE — Telephone Encounter (Signed)
OK for OV in APP clinic or mine RG

## 2023-12-16 ENCOUNTER — Encounter: Payer: Self-pay | Admitting: Gastroenterology

## 2023-12-16 NOTE — Telephone Encounter (Signed)
 Called and left voicemail for patient to call back and discuss scheduling.

## 2024-03-20 ENCOUNTER — Ambulatory Visit: Admitting: Gastroenterology

## 2024-05-08 ENCOUNTER — Ambulatory Visit: Admitting: Gastroenterology
